# Patient Record
Sex: Female | Born: 2000 | Race: White | Hispanic: No | Marital: Single | Smoking: Never smoker
Health system: Southern US, Community
[De-identification: ages and names within clinical notes are randomized; demographics above are authoritative.]

## PROBLEM LIST (undated history)

## (undated) VITALS — BP 145/82 | HR 98 | Temp 97.7°F | Resp 16 | Ht 67.0 in | Wt 349.2 lb

## (undated) DIAGNOSIS — J189 Pneumonia, unspecified organism: Secondary | ICD-10-CM

## (undated) DIAGNOSIS — E669 Obesity, unspecified: Secondary | ICD-10-CM

## (undated) DIAGNOSIS — F419 Anxiety disorder, unspecified: Secondary | ICD-10-CM

## (undated) HISTORY — DX: Obesity, unspecified: E66.9

## (undated) HISTORY — DX: Pneumonia, unspecified organism: J18.9

---
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
## 2022-01-04 ENCOUNTER — Other Ambulatory Visit (HOSPITAL_COMMUNITY): Payer: Self-pay

## 2022-01-16 ENCOUNTER — Ambulatory Visit (INDEPENDENT_AMBULATORY_CARE_PROVIDER_SITE_OTHER): Payer: 59 | Admitting: Nurse Practitioner

## 2022-01-16 ENCOUNTER — Encounter: Payer: Self-pay | Admitting: Nurse Practitioner

## 2022-01-16 VITALS — BP 126/70 | HR 93 | Temp 97.3°F | Ht 66.93 in | Wt 348.7 lb

## 2022-01-16 DIAGNOSIS — E559 Vitamin D deficiency, unspecified: Secondary | ICD-10-CM

## 2022-01-16 DIAGNOSIS — K760 Fatty (change of) liver, not elsewhere classified: Secondary | ICD-10-CM

## 2022-01-16 DIAGNOSIS — R5383 Other fatigue: Secondary | ICD-10-CM

## 2022-01-16 DIAGNOSIS — R112 Nausea with vomiting, unspecified: Secondary | ICD-10-CM | POA: Diagnosis not present

## 2022-01-16 DIAGNOSIS — E8881 Metabolic syndrome: Secondary | ICD-10-CM | POA: Diagnosis not present

## 2022-01-16 DIAGNOSIS — E119 Type 2 diabetes mellitus without complications: Secondary | ICD-10-CM

## 2022-01-16 DIAGNOSIS — K580 Irritable bowel syndrome with diarrhea: Secondary | ICD-10-CM | POA: Diagnosis not present

## 2022-01-16 DIAGNOSIS — E782 Mixed hyperlipidemia: Secondary | ICD-10-CM

## 2022-01-16 DIAGNOSIS — R635 Abnormal weight gain: Secondary | ICD-10-CM

## 2022-01-16 DIAGNOSIS — E88819 Insulin resistance, unspecified: Secondary | ICD-10-CM

## 2022-01-16 DIAGNOSIS — F411 Generalized anxiety disorder: Secondary | ICD-10-CM

## 2022-01-16 NOTE — Progress Notes (Signed)
Established patient visit   Patient: Kayla Kelly   DOB: May 29, 2001   21 y.o. Female  MRN: [id number] Visit Date: 01/16/2022   Chief Complaint  Patient presents with   Follow-up   Subjective    HPI  Follow up -continues to have nausea, vomiting, and diarrhea.  -stopped metformin and added ozempic to help with blood sugars. This has not helped much to improve her GI symptoms. Still waking up in the middle of the night to vomit.  -states that taking dicyclomine does help some. She states that this does worsen her anxiety. Continues to spend a great deal of each day in the bathroom.    Medications: Outpatient Medications Prior to Visit  Medication Sig   blood glucose meter kit and supplies KIT Use to test blood sugar daily   buPROPion (WELLBUTRIN XL) 150 MG 24 hr tablet Take 1 - 2 tablets by mouth daily. **PLEASE CONTACT OUR OFFICE TO SCHEDULE A FOLLOW UP FOR MED REFILLS**   dicyclomine (BENTYL) 20 MG tablet Take 1 tablet (20 mg total) by mouth 3 (three) times daily as needed for spasms.   etodolac (LODINE) 400 MG tablet Take 1 tablet (400 mg total) by mouth 2 (two) times daily.   FLUoxetine (PROZAC) 20 MG capsule Take 3 capsules (60 mg total) by mouth daily.   fluticasone (FLONASE) 50 MCG/ACT nasal spray Place 1-2 sprays into both nostrils daily.   glucose blood test strip Use to test blood sugar daily   Lancets (FREESTYLE) lancets Use to test blood sugar daily   Semaglutide,0.25 or 0.5MG/DOS, (OZEMPIC, 0.25 OR 0.5 MG/DOSE,) 2 MG/3ML SOPN Inject 0.25 mg into the skin once a week.   No facility-administered medications prior to visit.    Review of Systems  Last CBC Lab Results  Component Value Date   WBC 9.5 01/16/2022   HGB 14.2 01/16/2022   HCT 40.6 01/16/2022   MCV 83 01/16/2022   MCH 28.9 01/16/2022   RDW 13.3 01/16/2022   PLT 291 59/74/1638   Last metabolic panel Lab Results  Component Value Date   GLUCOSE 98 01/16/2022   NA 137 01/16/2022   K 4.1 01/16/2022    CL 99 01/16/2022   CO2 22 01/16/2022   BUN 13 01/16/2022   CREATININE 0.68 01/16/2022   EGFR 129 01/16/2022   CALCIUM 9.4 01/16/2022   PROT 7.2 01/16/2022   ALBUMIN 4.5 01/16/2022   LABGLOB 2.7 01/16/2022   AGRATIO 1.7 01/16/2022   BILITOT 0.5 01/16/2022   ALKPHOS 75 01/16/2022   AST 20 01/16/2022   ALT 25 01/16/2022   ANIONGAP 11 07/07/2019   Last lipids Lab Results  Component Value Date   CHOL 141 01/16/2022   HDL 31 (L) 01/16/2022   LDLCALC 65 01/16/2022   TRIG 281 (H) 01/16/2022   CHOLHDL 4.5 (H) 01/16/2022   Last hemoglobin A1c Lab Results  Component Value Date   HGBA1C 5.9 (H) 01/16/2022   Last thyroid functions Lab Results  Component Value Date   TSH 3.080 01/16/2022   T4TOTAL 7.5 07/12/2019   Last vitamin D Lab Results  Component Value Date   VD25OH 18.2 (L) 01/16/2022        Objective     Today's Vitals   01/16/22 1047 01/16/22 1118  BP: (!) 142/76 126/70  Pulse: 93   Temp: (!) 97.3 F (36.3 C)   SpO2: 100%   Weight: (!) 348 lb 11.2 oz (158.2 kg)   Height: 5' 6.93" (1.7 m)    Body  mass index is 54.73 kg/m.   BP Readings from Last 3 Encounters:  01/16/22 126/70  12/18/21 (!) 146/74  03/12/21 (!) 167/94    Wt Readings from Last 3 Encounters:  01/16/22 (!) 348 lb 11.2 oz (158.2 kg) (>99 %, Z= 2.92)*  12/18/21 (!) 362 lb 1.9 oz (164.3 kg) (>99 %, Z= 2.95)*  01/01/21 (!) 309 lb (140.2 kg) (>99 %, Z= 2.74)*   * Growth percentiles are based on CDC (Girls, 2-20 Years) data.    Physical Exam Vitals and nursing note reviewed.  Constitutional:      Appearance: Normal appearance. She is well-developed. She is obese.  HENT:     Head: Normocephalic and atraumatic.     Nose: Nose normal.     Mouth/Throat:     Mouth: Mucous membranes are moist.     Pharynx: Oropharynx is clear.  Eyes:     Extraocular Movements: Extraocular movements intact.     Conjunctiva/sclera: Conjunctivae normal.     Pupils: Pupils are equal, round, and reactive to  light.  Cardiovascular:     Rate and Rhythm: Normal rate and regular rhythm.     Pulses: Normal pulses.     Heart sounds: Normal heart sounds.  Pulmonary:     Effort: Pulmonary effort is normal.     Breath sounds: Normal breath sounds.  Abdominal:     General: Bowel sounds are normal. There is no distension.     Palpations: Abdomen is soft. There is no mass.     Tenderness: There is abdominal tenderness. There is right CVA tenderness. There is no left CVA tenderness, guarding or rebound.     Hernia: No hernia is present.  Musculoskeletal:        General: Normal range of motion.     Cervical back: Normal range of motion and neck supple.  Lymphadenopathy:     Cervical: No cervical adenopathy.  Skin:    General: Skin is warm and dry.     Capillary Refill: Capillary refill takes less than 2 seconds.  Neurological:     General: No focal deficit present.     Mental Status: She is alert and oriented to person, place, and time.  Psychiatric:        Mood and Affect: Mood normal.        Behavior: Behavior normal.        Thought Content: Thought content normal.        Judgment: Judgment normal.       Assessment & Plan    1. Irritable bowel syndrome with diarrhea Check labs including CMP, amylase, and lipase.  Refer to GI for further evaluation. - Amylase; Future - Lipase; Future - Ambulatory referral to Gastroenterology - Lipase - Amylase - Comprehensive metabolic panel  2. Nausea and vomiting, unspecified vomiting type Unclear etiology.  Refer to GI for further evaluation. - Ambulatory referral to Gastroenterology  3. Hepatic steatosis Check amylase and lipase - Amylase; Future - Lipase; Future - Lipase - Amylase  4. Weight gain, abnormal Check TSH, free T4, CMP, and CBC for further evaluation. - T4, free - TSH - Comprehensive metabolic panel - CBC with Differential/Platelet  5. Mixed hyperlipidemia Check fasting lipid panel and treat hyperlipidemia as  indicated. - Lipid panel; Future - Comprehensive metabolic panel; Future - Comprehensive metabolic panel - Lipid panel - Lipid panel  6. Other fatigue Check CBC and TSH and free T4. - TSH + free T4; Future - CBC; Future - CBC - TSH +  free T4  7. Vitamin D deficiency Check vitamin D and treat deficiency as indicated. - VITAMIN D 25 Hydroxy (Vit-D Deficiency, Fractures); Future - VITAMIN D 25 Hydroxy (Vit-D Deficiency, Fractures)  8. Type 2 diabetes mellitus without complication, without long-term current use of insulin (HCC) Check hemoglobin A1c and urine microalbumin.  Increase Ozempic to 0.5 mg weekly. - Hemoglobin A1c; Future - Comprehensive metabolic panel; Future - CBC; Future - CBC - Comprehensive metabolic panel - Hemoglobin A1c - Microalbumin / creatinine urine ratio - T4, free - TSH - Comprehensive metabolic panel - CBC with Differential/Platelet  9. GAD (generalized anxiety disorder) Continue fluoxetine as previously prescribed.   Problem List Items Addressed This Visit       Digestive   Hepatic steatosis   Relevant Orders   Amylase (Completed)   Lipase (Completed)   Irritable bowel syndrome with diarrhea - Primary   Relevant Orders   Amylase (Completed)   Lipase (Completed)   Ambulatory referral to Gastroenterology   Nausea and vomiting   Relevant Orders   Ambulatory referral to Gastroenterology     Endocrine   Insulin resistance   Relevant Orders   Hemoglobin A1c (Completed)   Type 2 diabetes mellitus without complication, without long-term current use of insulin (HCC)   Relevant Orders   Hemoglobin A1c (Completed)   Comprehensive metabolic panel (Completed)   CBC (Completed)     Other   GAD (generalized anxiety disorder)   Vitamin D deficiency   Relevant Orders   VITAMIN D 25 Hydroxy (Vit-D Deficiency, Fractures) (Completed)   Mixed hyperlipidemia   Relevant Orders   Lipid panel (Completed)   Comprehensive metabolic panel (Completed)    Weight gain, abnormal   Other fatigue   Relevant Orders   TSH + free T4 (Completed)   CBC (Completed)     Return in about 6 weeks (around 02/27/2022) for mood, med refills.         Ronnell Freshwater, NP  [facility] at [facility] [phone] (phone) [phone] (fax)  [place]

## 2022-01-17 LAB — COMPREHENSIVE METABOLIC PANEL
ALT: 25 IU/L (ref 0–32)
AST: 20 IU/L (ref 0–40)
Albumin/Globulin Ratio: 1.7 (ref 1.2–2.2)
Albumin: 4.5 g/dL (ref 3.9–5.0)
Alkaline Phosphatase: 75 IU/L (ref 42–106)
BUN/Creatinine Ratio: 19 (ref 9–23)
BUN: 13 mg/dL (ref 6–20)
Bilirubin Total: 0.5 mg/dL (ref 0.0–1.2)
CO2: 22 mmol/L (ref 20–29)
Calcium: 9.4 mg/dL (ref 8.7–10.2)
Chloride: 99 mmol/L (ref 96–106)
Creatinine, Ser: 0.68 mg/dL (ref 0.57–1.00)
Globulin, Total: 2.7 g/dL (ref 1.5–4.5)
Glucose: 98 mg/dL (ref 70–99)
Potassium: 4.1 mmol/L (ref 3.5–5.2)
Sodium: 137 mmol/L (ref 134–144)
Total Protein: 7.2 g/dL (ref 6.0–8.5)
eGFR: 129 mL/min/{1.73_m2} (ref >59)

## 2022-01-17 LAB — CBC
Hematocrit: 40.6 % (ref 34.0–46.6)
Hemoglobin: 14.2 g/dL (ref 11.1–15.9)
MCH: 28.9 pg (ref 26.6–33.0)
MCHC: 35 g/dL (ref 31.5–35.7)
MCV: 83 fL (ref 79–97)
Platelets: 291 10*3/uL (ref 150–450)
RBC: 4.92 x10E6/uL (ref 3.77–5.28)
RDW: 13.3 % (ref 11.7–15.4)
WBC: 9.5 10*3/uL (ref 3.4–10.8)

## 2022-01-17 LAB — TSH+FREE T4
Free T4: 1.24 ng/dL (ref 0.93–1.60)
TSH: 3.08 u[IU]/mL (ref 0.450–4.500)

## 2022-01-17 LAB — LIPID PANEL
Chol/HDL Ratio: 4.5 ratio — ABNORMAL HIGH (ref 0.0–4.4)
Cholesterol, Total: 141 mg/dL (ref 100–169)
HDL: 31 mg/dL — ABNORMAL LOW (ref >39)
LDL Chol Calc (NIH): 65 mg/dL (ref 0–109)
Triglycerides: 281 mg/dL — ABNORMAL HIGH (ref 0–89)
VLDL Cholesterol Cal: 45 mg/dL — ABNORMAL HIGH (ref 5–40)

## 2022-01-17 LAB — HEMOGLOBIN A1C
Est. average glucose Bld gHb Est-mCnc: 123 mg/dL
Hgb A1c MFr Bld: 5.9 % — ABNORMAL HIGH (ref 4.8–5.6)

## 2022-01-17 LAB — VITAMIN D 25 HYDROXY (VIT D DEFICIENCY, FRACTURES): Vit D, 25-Hydroxy: 18.2 ng/mL — ABNORMAL LOW (ref 30.0–100.0)

## 2022-01-17 LAB — LIPASE: Lipase: 36 U/L (ref 14–72)

## 2022-01-17 LAB — AMYLASE: Amylase: 26 U/L — ABNORMAL LOW (ref 31–110)

## 2022-01-17 NOTE — Progress Notes (Signed)
Lipids mildly elevated. Patient has vitamin d deficiency. Treat with drisdol

## 2022-01-18 ENCOUNTER — Telehealth: Payer: Self-pay | Admitting: Nurse Practitioner

## 2022-01-18 NOTE — Telephone Encounter (Signed)
Patient left vm stating she had a couple of questions regarding her medication and would like for you to call her at [phone]

## 2022-01-18 NOTE — Telephone Encounter (Signed)
Pt called wanting to know should her Ozempic be upgraded

## 2022-01-20 DIAGNOSIS — R110 Nausea: Secondary | ICD-10-CM | POA: Insufficient documentation

## 2022-01-20 DIAGNOSIS — R112 Nausea with vomiting, unspecified: Secondary | ICD-10-CM | POA: Insufficient documentation

## 2022-01-20 DIAGNOSIS — R635 Abnormal weight gain: Secondary | ICD-10-CM | POA: Insufficient documentation

## 2022-01-20 DIAGNOSIS — R5383 Other fatigue: Secondary | ICD-10-CM | POA: Insufficient documentation

## 2022-01-20 NOTE — Telephone Encounter (Signed)
Does she mean the dose increased?

## 2022-01-21 NOTE — Telephone Encounter (Signed)
Called pt LVM to contact the office °

## 2022-01-21 NOTE — Telephone Encounter (Signed)
Please let her know that she can start increasing this to 0.5 mg weekly with he next dose. The higher dose is in the same pen. Thanks  -HB

## 2022-01-23 NOTE — Telephone Encounter (Signed)
Called pt LVM to contact the office °

## 2022-01-24 NOTE — Telephone Encounter (Signed)
Called pt she is advised about her Rx of her new dose

## 2022-02-05 ENCOUNTER — Other Ambulatory Visit: Payer: Self-pay | Admitting: Nurse Practitioner

## 2022-02-05 ENCOUNTER — Other Ambulatory Visit (HOSPITAL_COMMUNITY): Payer: Self-pay

## 2022-02-05 DIAGNOSIS — E119 Type 2 diabetes mellitus without complications: Secondary | ICD-10-CM

## 2022-02-05 MED ORDER — OZEMPIC (0.25 OR 0.5 MG/DOSE) 2 MG/3ML ~~LOC~~ SOPN
0.2500 mg | PEN_INJECTOR | SUBCUTANEOUS | 2 refills | Status: DC
  Filled 2022-02-05: qty 3, 56d supply, fill #0
  Filled 2022-04-19: qty 3, 56d supply, fill #1
  Filled 2022-05-31: qty 3, 56d supply, fill #2

## 2022-02-05 NOTE — Progress Notes (Signed)
Discuss with patient at visit 02/25/2022.

## 2022-02-09 ENCOUNTER — Other Ambulatory Visit (HOSPITAL_COMMUNITY): Payer: Self-pay

## 2022-02-20 ENCOUNTER — Telehealth: Payer: Self-pay | Admitting: Nurse Practitioner

## 2022-02-20 NOTE — Telephone Encounter (Signed)
Patient needs immunization forms printed for college. If you can do this and I can call her when ready.

## 2022-02-20 NOTE — Telephone Encounter (Signed)
Done. Placed in the accordion file. AS, CMA

## 2022-02-24 NOTE — Progress Notes (Deleted)
Established patient visit   Patient: Kayla Kelly   DOB: 2001-05-13   21 y.o. Female  MRN: [id number] Visit Date: 02/25/2022   No chief complaint on file.  Subjective    HPI  Follow up  -increased ozempic to 0.5 mg weekly  -now taking bupropion XL 150 mg - increased dose to 1 to 2 tablets daily. Taking prozac 60 mg daily.  -referred to GI due to moderate IBS with diarrhea.    Medications: Outpatient Medications Prior to Visit  Medication Sig   blood glucose meter kit and supplies KIT Use to test blood sugar daily   buPROPion (WELLBUTRIN XL) 150 MG 24 hr tablet Take 1 - 2 tablets by mouth daily. **PLEASE CONTACT OUR OFFICE TO SCHEDULE A FOLLOW UP FOR MED REFILLS**   dicyclomine (BENTYL) 20 MG tablet Take 1 tablet (20 mg total) by mouth 3 (three) times daily as needed for spasms.   etodolac (LODINE) 400 MG tablet Take 1 tablet (400 mg total) by mouth 2 (two) times daily.   FLUoxetine (PROZAC) 20 MG capsule Take 3 capsules (60 mg total) by mouth daily.   fluticasone (FLONASE) 50 MCG/ACT nasal spray Place 1-2 sprays into both nostrils daily.   glucose blood test strip Use to test blood sugar daily   Lancets (FREESTYLE) lancets Use to test blood sugar daily   Semaglutide,0.25 or 0.5MG/DOS, (OZEMPIC, 0.25 OR 0.5 MG/DOSE,) 2 MG/3ML SOPN Inject 0.25 mg into the skin once a week.   No facility-administered medications prior to visit.    Review of Systems  {Labs (Optional):23779}   Objective    There were no vitals taken for this visit. BP Readings from Last 3 Encounters:  01/16/22 126/70  12/18/21 (!) 146/74  03/12/21 (!) 167/94    Wt Readings from Last 3 Encounters:  01/16/22 (!) 348 lb 11.2 oz (158.2 kg) (>99 %, Z= 2.92)*  12/18/21 (!) 362 lb 1.9 oz (164.3 kg) (>99 %, Z= 2.95)*  01/01/21 (!) 309 lb (140.2 kg) (>99 %, Z= 2.74)*   * Growth percentiles are based on CDC (Girls, 2-20 Years) data.    Physical Exam  ***  No results found for any visits on 02/25/22.   Assessment & Plan     Problem List Items Addressed This Visit   None    No follow-ups on file.         Ronnell Freshwater, NP  [facility] at [facility] [phone] (phone) [phone] (fax)  [place]

## 2022-02-25 ENCOUNTER — Ambulatory Visit: Payer: 59 | Admitting: Nurse Practitioner

## 2022-03-04 DIAGNOSIS — R110 Nausea: Secondary | ICD-10-CM | POA: Diagnosis not present

## 2022-03-04 DIAGNOSIS — Z23 Encounter for immunization: Secondary | ICD-10-CM | POA: Diagnosis not present

## 2022-03-05 ENCOUNTER — Other Ambulatory Visit (HOSPITAL_COMMUNITY): Payer: Self-pay

## 2022-03-27 ENCOUNTER — Other Ambulatory Visit (HOSPITAL_COMMUNITY): Payer: Self-pay

## 2022-03-27 ENCOUNTER — Encounter: Payer: Self-pay | Admitting: Gastroenterology

## 2022-03-27 ENCOUNTER — Other Ambulatory Visit (INDEPENDENT_AMBULATORY_CARE_PROVIDER_SITE_OTHER): Payer: 59

## 2022-03-27 ENCOUNTER — Ambulatory Visit (INDEPENDENT_AMBULATORY_CARE_PROVIDER_SITE_OTHER): Payer: 59 | Admitting: Gastroenterology

## 2022-03-27 VITALS — BP 130/90 | HR 110 | Ht 67.0 in | Wt 344.4 lb

## 2022-03-27 DIAGNOSIS — R112 Nausea with vomiting, unspecified: Secondary | ICD-10-CM

## 2022-03-27 DIAGNOSIS — R197 Diarrhea, unspecified: Secondary | ICD-10-CM | POA: Diagnosis not present

## 2022-03-27 LAB — COMPREHENSIVE METABOLIC PANEL
ALT: 35 U/L (ref 0–35)
AST: 31 U/L (ref 0–37)
Albumin: 4.2 g/dL (ref 3.5–5.2)
Alkaline Phosphatase: 61 U/L (ref 47–119)
BUN: 13 mg/dL (ref 6–23)
CO2: 27 mEq/L (ref 19–32)
Calcium: 9.4 mg/dL (ref 8.4–10.5)
Chloride: 102 mEq/L (ref 96–112)
Creatinine, Ser: 0.61 mg/dL (ref 0.40–1.20)
GFR: 130.16 mL/min (ref >60.00)
Glucose, Bld: 80 mg/dL (ref 70–99)
Potassium: 3.9 mEq/L (ref 3.5–5.1)
Sodium: 138 mEq/L (ref 135–145)
Total Bilirubin: 0.6 mg/dL (ref 0.3–1.2)
Total Protein: 7.5 g/dL (ref 6.0–8.3)

## 2022-03-27 LAB — C-REACTIVE PROTEIN: CRP: <1 mg/dL (ref 0.5–20.0)

## 2022-03-27 MED ORDER — PANTOPRAZOLE SODIUM 40 MG PO TBEC
40.0000 mg | DELAYED_RELEASE_TABLET | Freq: Two times a day (BID) | ORAL | 5 refills | Status: DC
Start: 2022-03-27 — End: 2023-03-07
  Filled 2022-03-27: qty 60, 30d supply, fill #0

## 2022-03-27 NOTE — Patient Instructions (Signed)
It was my pleasure to provide care to you today. Based on our discussion, I am providing you with my recommendations below:  RECOMMENDATION(S):   Consider trial of peppermint (Roena Sassaman's Tummy Tamers).  Consider alternative treatments to Ozempic.  PRESCRIPTION MEDICATION(S):   We have sent the following medication(s) to your pharmacy:  Pantoprazole 40 mg twice daily 30-60 minutes before breakfast and dinner.   LABS:   Please proceed to the basement level for lab work before leaving today. Press "B" on the elevator. The lab is located at the first door on the left as you exit the elevator.  ABDOMINAL ULTRASOUND:  You have been scheduled for an abdominal ultrasound at [facility] (1st floor of the hospital) on Thursday 03/28/22 at 10 am. Please arrive @ 9:30 am for registration.   PREP:   Please DO NOT eat or drink anything 6 hours prior to your test.  NEED TO RESCHEDULE?   Please call radiology at [phone].  FOLLOW UP:  I would like for you to follow up with me in 4 weeks. Please call the office at [phone] to schedule your appointment.  BMI:  If you are age 19 or older, your body mass index should be between 23-30. Your Body mass index is 53.94 kg/m. If this is out of the aforementioned range listed, please consider follow up with your Primary Care Provider.  If you are age 39 or younger, your body mass index should be between 19-25. Your Body mass index is 53.94 kg/m. If this is out of the aformentioned range listed, please consider follow up with your Primary Care Provider.   MY CHART:  The [place] [facility] providers would like to encourage you to use [facility] to communicate with providers for non-urgent requests or questions.  Due to long hold times on the telephone, sending your provider a message by [facility] may be a faster and more efficient way to get a response.  Please allow 48 business hours for a response.  Please remember that this is for non-urgent  requests.   Thank you for trusting me with your gastrointestinal care!    Tressia Danas, MD, MPH

## 2022-03-27 NOTE — Progress Notes (Signed)
Referring Provider: Ronnell Freshwater, NP Primary Care Physician:  Ronnell Freshwater, NP   Reason for Consultation:  Diarrhea, nausea, and vomiting   IMPRESSION:  Diarrhea, nausea, and vomiting BMI 53.94 Ongoing treatment with Ozempic Echogenic liver on ultrasound a [facility] 2020  GI symptoms may be related to Ozempic. However, she is concerned that symptoms predate starting Ozempic. Differential is broad.   PLAN: - Start pantoprazole 40 mg BID - Consider trial of peppermint (Heather's Tummy Tamers) if not responding to pantoprazole - Consider alternative treatments to Ozempic - Stool for fecal calprotectin, pancreatic elastase, H pylori stool antigen and Giardia - TTGA and IgA to screen for celiac disease - Alpha Gal panel - CMP, CRP - Abdominal ultrasound - Gastric emptying scan to be considered although not likely to be helpful in the setting of Ozempic  - Office follow-up in 2-4 weeks, earlier if needed   HPI: Kayla Kelly is a 21 y.o. female referred by NP [facility] for further evaluation of IBS with diarrhea, nausea, and vomiting.  The history is obtained through the patient and review of her electronic health record.  She has anxiety, depression, type 2 diabetes, vitamin D deficiency, hyperlipidemia, elevated liver enzymes and anxiety. She started at [employer] two weeks ago. She is looking at fashion merchandising.   Lifetime history of GI symptoms.   Currently has watery diarrhea 5 times daily, nausea, and vomiting particularly on an empty stomach and when she eats. Symptoms first started in 8th grade but it has been significantly worse over the last year. She feels like she is in the bathroom all day even though she has a sense of complete evacuation. Associated diaphoresis. Frequently has vomiting when she first wakes up. Symptoms are then improved.  No blood or mucous in the stool.  She has diabetes.  This was previously managed with metformin but she is currently  using Ozempic. She feels like the symptoms predate starting Ozempic.  She continues to have nocturnal awakening with the need to vomit.  She will use protein shakes even up to every meal because she can't tolerate other symptoms. She does not find the protein shakes worsen her symptoms. No specific foods that trigger her symptoms, although she is unable to eat red meat when she is not feeling well.  She has been using dicyclomine with some improvement in her symptoms. Ginger tea provides minimal relief. She has tried reducing sweats and eating cleaner but her symptoms don't change. Symptoms present even while on Atkins last year. Rare use of TUMS or acid reflux. No other treatment trials.   Labs 01/16/2022 including normal CBC and CMP, hemoglobin A1c 5.9, TSH 3.080, normal lipase and a low amylase.  ALT 25.  Abdominal ultrasound at [facility] in 2020 to evaluate elevated liver enzymes showed an echogenic liver  No prior endoscopic evaluation  Mom and Dad have had gastric bypass. They both have chronic GI symptoms.  There is no known family history of colon cancer or polyps. No family history of stomach cancer or other GI malignancy. No family history of inflammatory bowel disease or celiac.  There is a family history of factor V Leiden.  She has some questions about Crohn's after her online searching.    Past Medical History:  Diagnosis Date   Anxiety     No past surgical history on file.    Current Outpatient Medications  Medication Sig Dispense Refill   blood glucose meter kit and supplies KIT Use to test blood  sugar daily 1 each 0   buPROPion (WELLBUTRIN XL) 150 MG 24 hr tablet Take 1 - 2 tablets by mouth daily. **PLEASE CONTACT OUR OFFICE TO SCHEDULE A FOLLOW UP FOR MED REFILLS** 60 tablet 1   dicyclomine (BENTYL) 20 MG tablet Take 1 tablet (20 mg total) by mouth 3 (three) times daily as needed for spasms. 45 tablet 1   etodolac (LODINE) 400 MG tablet Take 1 tablet (400 mg total)  by mouth 2 (two) times daily. 60 tablet 1   FLUoxetine (PROZAC) 20 MG capsule Take 3 capsules (60 mg total) by mouth daily. 270 capsule 1   fluticasone (FLONASE) 50 MCG/ACT nasal spray Place 1-2 sprays into both nostrils daily. 16 g 0   glucose blood test strip Use to test blood sugar daily 100 each 0   Lancets (FREESTYLE) lancets Use to test blood sugar daily 100 each 0   Semaglutide,0.25 or 0.5MG/DOS, (OZEMPIC, 0.25 OR 0.5 MG/DOSE,) 2 MG/3ML SOPN Inject 0.25 mg into the skin once a week. 3 mL 2   No current facility-administered medications for this visit.    Allergies as of 03/27/2022 - Review Complete 01/16/2022  Allergen Reaction Noted   Benzalkonium chloride Anaphylaxis 12/16/2018   Neomycin-bacitracin zn-polymyx Anaphylaxis 12/12/2020   Neosporin [bacitracin-polymyxin b]  07/06/2019    Family History  Problem Relation Age of Onset   Heart attack Mother    High Cholesterol Mother    High blood pressure Mother    High Cholesterol Father    High blood pressure Father    Cancer - Cervical Maternal Grandmother    Cancer - Colon Maternal Grandmother     Social History   Socioeconomic History   Marital status: Single    Spouse name: Not on file   Number of children: Not on file   Years of education: Not on file   Highest education level: Not on file  Occupational History   Not on file  Tobacco Use   Smoking status: Never   Smokeless tobacco: Never  Vaping Use   Vaping Use: Never used  Substance and Sexual Activity   Alcohol use: Never   Drug use: Never   Sexual activity: Never  Other Topics Concern   Not on file  Social History Narrative   Not on file   Social Determinants of Health   Financial Resource Strain: Not on file  Food Insecurity: Not on file  Transportation Needs: Not on file  Physical Activity: Not on file  Stress: Not on file  Social Connections: Not on file  Intimate Partner Violence: Not on file    Review of Systems: 12 system ROS is  negative except as noted above with the addition of anxiety, back pain, depression, fatigue, headaches, night sweats.   Physical Exam: General:   Alert,  well-nourished, pleasant and cooperative in NAD Head:  Normocephalic and atraumatic. Eyes:  Sclera clear, no icterus.   Conjunctiva pink. Ears:  Normal auditory acuity. Nose:  No deformity, discharge,  or lesions. Mouth:  No deformity or lesions.   Neck:  Supple; no masses or thyromegaly. Lungs:  Clear throughout to auscultation.   No wheezes. Heart:  Regular rate and rhythm; no murmurs. Abdomen:  Soft, central obesity, nontender, nondistended, normal bowel sounds, no rebound or guarding. No hepatosplenomegaly.   Rectal:  Deferred  Msk:  Symmetrical. No boney deformities LAD: No inguinal or umbilical LAD Extremities:  No clubbing or edema. Neurologic:  Alert and  oriented x4;  grossly nonfocal Skin:  Intact without significant lesions or rashes. Psych:  Alert and cooperative. Normal mood and affect.     Ardyth Kelso L. Tarri Glenn, MD, MPH 03/27/2022, 1:15 PM

## 2022-03-28 ENCOUNTER — Ambulatory Visit (HOSPITAL_COMMUNITY): Payer: 59

## 2022-04-01 LAB — TISSUE TRANSGLUTAMINASE ABS,IGG,IGA
(tTG) Ab, IgA: <1 U/mL
(tTG) Ab, IgG: <1 U/mL

## 2022-04-01 LAB — ALPHA-GAL PANEL
Allergen, Mutton, f88: <0.1 kU/L
Allergen, Pork, f26: <0.1 kU/L
Beef: <0.1 kU/L
CLASS: 0
CLASS: 0
Class: 0
GALACTOSE-ALPHA-1,3-GALACTOSE IGE*: <0.1 kU/L (ref <0.10)

## 2022-04-01 LAB — IGA: Immunoglobulin A: 252 mg/dL (ref 47–310)

## 2022-04-01 LAB — INTERPRETATION:

## 2022-04-05 ENCOUNTER — Other Ambulatory Visit (HOSPITAL_COMMUNITY): Payer: Self-pay

## 2022-04-19 ENCOUNTER — Other Ambulatory Visit (HOSPITAL_COMMUNITY): Payer: Self-pay

## 2022-04-24 ENCOUNTER — Ambulatory Visit: Payer: 59 | Admitting: Gastroenterology

## 2022-05-10 ENCOUNTER — Ambulatory Visit: Payer: 59 | Admitting: Radiology

## 2022-05-10 ENCOUNTER — Encounter: Payer: Self-pay | Admitting: Radiology

## 2022-05-10 VITALS — BP 158/90 | Ht 67.0 in | Wt 349.0 lb

## 2022-05-10 DIAGNOSIS — D6851 Activated protein C resistance: Secondary | ICD-10-CM | POA: Diagnosis not present

## 2022-05-10 DIAGNOSIS — R030 Elevated blood-pressure reading, without diagnosis of hypertension: Secondary | ICD-10-CM

## 2022-05-10 DIAGNOSIS — Z3009 Encounter for other general counseling and advice on contraception: Secondary | ICD-10-CM

## 2022-05-10 DIAGNOSIS — Z01419 Encounter for gynecological examination (general) (routine) without abnormal findings: Secondary | ICD-10-CM

## 2022-05-10 MED ORDER — SLYND 4 MG PO TABS: 1.0000 | ORAL_TABLET | Freq: Every day | ORAL | 4 refills | Status: DC

## 2022-05-10 NOTE — Progress Notes (Signed)
   Shataria Crist 07-04-2001 [id number]   History:  21 y.o. G0 presents for annual exam as a new patient. Hx of Factor V Leiden. Wants to discuss [facility] options, yet to have sexual debut. Bisexual. In school at [employer].  Gynecologic History Patient's last menstrual period was 05/03/2022 (approximate). Period Pattern: (!) Irregular Menstrual Control: Maxi pad, Panty liner Dysmenorrhea: (!) Mild Dysmenorrhea Symptoms: Cramping Contraception/Family planning: abstinence Sexually active: no   Obstetric History OB History  Gravida Para Term Preterm AB Living  0 0 0 0 0 0  SAB IAB Ectopic Multiple Live Births  0 0 0 0 0     The following portions of the patient's history were reviewed and updated as appropriate: allergies, current medications, past family history, past medical history, past social history, past surgical history, and problem list.  Review of Systems Pertinent items noted in HPI and remainder of comprehensive ROS otherwise negative.   Past medical history, past surgical history, family history and social history were all reviewed and documented in the EPIC chart.   Exam:  Vitals:   05/10/22 1543  BP: (!) 162/92  Weight: (!) 349 lb (158.3 kg)  Height: 5\' 7"  (1.702 m)  Repeat BP 158/90 Body mass index is 54.66 kg/m.  General appearance:  Normal, obese Respiratory  Auscultation:  Clear without wheezing or rhonchi Cardiovascular  Auscultation:  Regular rate, without rubs, murmurs or gallops  Edema/varicosities:  Not grossly evident Abdominal  Soft,nontender, without masses, guarding or rebound.  Liver/spleen:  No organomegaly noted  Hernia:  None appreciated  Skin  Inspection:  Grossly normal Breasts: Examined lying and sitting.   Right: Without masses, retractions, nipple discharge or axillary adenopathy.   Left: Without masses, retractions, nipple discharge or axillary adenopathy. Genitourinary   deferred   Patient informed chaperone available to be present  for breast and pelvic exam. Patient has requested no chaperone to be present. Patient has been advised what will be completed during breast and pelvic exam.   Assessment/Plan:   1. Well woman exam with routine gynecological exam Pap at 23 STI screen once sexually active  2. Counseling for birth control, oral contraceptives - Drospirenone (SLYND) 4 MG TABS; Take 1 tablet by mouth daily.  Dispense: 84 tablet; Refill: 4  3. Factor V Leiden mutation [facility]) Not an estrogen candidate Discussed all progesterone only options including POPs, Nexplanon, IUD and depo  4. Elevated blood pressure reading Continue to monitor F/u with PCP   Discussed SBE,pap and STI screening as directed/appropriate. Recommend 14mins of exercise weekly, including weight bearing exercise. Encouraged the use of seatbelts and sunscreen. Return in 1 year for annual or as needed.   Rubbie Battiest B WHNP-BC 4:09 PM 05/10/2022

## 2022-05-16 ENCOUNTER — Telehealth: Payer: Self-pay

## 2022-05-16 NOTE — Telephone Encounter (Signed)
Pt is calling after being prescribed Drospirenone (SLYND) 4 MG TABS And currently taking Semaglutide,0.25 or 0.5MG /DOS, (OZEMPIC, 0.25 OR 0.5 MG/DOSE,) 2 MG/3ML SOPN.  Pt is concerned that the [place] may cause her Birth control to not be as effective as it should be. Pt is wondering should she be concerned.  Please advise

## 2022-05-21 NOTE — Telephone Encounter (Signed)
Please let her know that I reviewed the interactions. The ozempic does delay the absorption of oral medications as it does delay gastric emptying. With the [facility], she needs to make sure she takes it on routine basis at the same time each day in order to keep the levels steady.

## 2022-05-21 NOTE — Telephone Encounter (Signed)
Called pt LVM to contact the office °

## 2022-05-22 NOTE — Telephone Encounter (Signed)
Called pt she is advised of the interaction of the medication and her recommendation

## 2022-05-31 ENCOUNTER — Other Ambulatory Visit: Payer: Self-pay | Admitting: Nurse Practitioner

## 2022-05-31 ENCOUNTER — Other Ambulatory Visit (HOSPITAL_COMMUNITY): Payer: Self-pay

## 2022-05-31 DIAGNOSIS — F411 Generalized anxiety disorder: Secondary | ICD-10-CM

## 2022-05-31 DIAGNOSIS — E119 Type 2 diabetes mellitus without complications: Secondary | ICD-10-CM

## 2022-06-03 ENCOUNTER — Other Ambulatory Visit (HOSPITAL_COMMUNITY): Payer: Self-pay

## 2022-06-03 MED ORDER — OZEMPIC (0.25 OR 0.5 MG/DOSE) 2 MG/3ML ~~LOC~~ SOPN
0.2500 mg | PEN_INJECTOR | SUBCUTANEOUS | 0 refills | Status: DC
  Filled 2022-06-03 (×2): qty 9, 168d supply, fill #0
  Filled 2022-06-05: qty 3, 56d supply, fill #0

## 2022-06-03 MED ORDER — BUPROPION HCL ER (XL) 150 MG PO TB24
150.0000 mg | ORAL_TABLET | Freq: Every day | ORAL | 0 refills | Status: DC
  Filled 2022-06-03 – 2022-06-12 (×2): qty 180, 90d supply, fill #0

## 2022-06-05 ENCOUNTER — Other Ambulatory Visit (HOSPITAL_COMMUNITY): Payer: Self-pay

## 2022-06-10 ENCOUNTER — Other Ambulatory Visit (HOSPITAL_COMMUNITY): Payer: Self-pay

## 2022-06-10 DIAGNOSIS — N921 Excessive and frequent menstruation with irregular cycle: Secondary | ICD-10-CM

## 2022-06-11 ENCOUNTER — Other Ambulatory Visit (HOSPITAL_COMMUNITY): Payer: Self-pay

## 2022-06-11 NOTE — Telephone Encounter (Signed)
Skip the placebos and Start the new pack early

## 2022-06-12 ENCOUNTER — Other Ambulatory Visit (HOSPITAL_COMMUNITY): Payer: Self-pay

## 2022-06-14 ENCOUNTER — Other Ambulatory Visit (HOSPITAL_COMMUNITY): Payer: Self-pay

## 2022-06-26 NOTE — Telephone Encounter (Signed)
It may take a full pack to resolve the bleeding

## 2022-07-09 ENCOUNTER — Other Ambulatory Visit: Payer: Self-pay | Admitting: Nurse Practitioner

## 2022-07-09 ENCOUNTER — Encounter: Payer: Self-pay | Admitting: Nurse Practitioner

## 2022-07-09 DIAGNOSIS — E119 Type 2 diabetes mellitus without complications: Secondary | ICD-10-CM

## 2022-07-09 NOTE — Telephone Encounter (Signed)
Order placed for ultrasound. Please help with scheduling.

## 2022-07-09 NOTE — Telephone Encounter (Signed)
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 She will likely bleeding more with a POP that is not Slynd but we can try it. Her other option would be an IUD.

## 2022-07-09 NOTE — Telephone Encounter (Signed)
Schedule us

## 2022-07-10 ENCOUNTER — Other Ambulatory Visit: Payer: Self-pay | Admitting: Nurse Practitioner

## 2022-07-10 ENCOUNTER — Other Ambulatory Visit (HOSPITAL_COMMUNITY): Payer: Self-pay

## 2022-07-10 DIAGNOSIS — E119 Type 2 diabetes mellitus without complications: Secondary | ICD-10-CM

## 2022-07-10 MED ORDER — SEMAGLUTIDE (1 MG/DOSE) 4 MG/3ML ~~LOC~~ SOPN
1.0000 mg | PEN_INJECTOR | SUBCUTANEOUS | 3 refills | Status: DC
  Filled 2022-07-10 – 2022-07-25 (×3): qty 3, 28d supply, fill #0
  Filled 2022-08-23: qty 3, 28d supply, fill #1
  Filled 2022-09-14 – 2022-09-16 (×2): qty 3, 28d supply, fill #2

## 2022-07-11 ENCOUNTER — Other Ambulatory Visit (HOSPITAL_COMMUNITY): Payer: Self-pay

## 2022-07-25 ENCOUNTER — Other Ambulatory Visit: Payer: Self-pay

## 2022-07-25 ENCOUNTER — Other Ambulatory Visit: Payer: Self-pay | Admitting: Nurse Practitioner

## 2022-07-25 ENCOUNTER — Other Ambulatory Visit (HOSPITAL_COMMUNITY): Payer: Self-pay

## 2022-07-25 DIAGNOSIS — F332 Major depressive disorder, recurrent severe without psychotic features: Secondary | ICD-10-CM

## 2022-07-25 DIAGNOSIS — F411 Generalized anxiety disorder: Secondary | ICD-10-CM

## 2022-07-25 MED ORDER — FLUOXETINE HCL 20 MG PO CAPS
60.0000 mg | ORAL_CAPSULE | Freq: Every day | ORAL | 1 refills | Status: DC
  Filled 2022-07-25 – 2022-09-14 (×3): qty 270, 90d supply, fill #0

## 2022-07-26 ENCOUNTER — Other Ambulatory Visit (HOSPITAL_COMMUNITY): Payer: Self-pay

## 2022-07-30 NOTE — Telephone Encounter (Signed)
We do not offer the abortion pills to have on hand and we do not offer abortion services in office. She would need to be referred to [facility] should she need those services.  I would recommend paying OOP for her generic pills this month if she can not get in touch with her insurance company. If she has had any unprotected sex in the past 5 days we can send in plan B as well.

## 2022-08-16 ENCOUNTER — Other Ambulatory Visit (HOSPITAL_BASED_OUTPATIENT_CLINIC_OR_DEPARTMENT_OTHER): Payer: Self-pay

## 2022-08-23 ENCOUNTER — Other Ambulatory Visit (HOSPITAL_COMMUNITY): Payer: Self-pay

## 2022-08-26 ENCOUNTER — Other Ambulatory Visit (HOSPITAL_COMMUNITY): Payer: Self-pay

## 2022-09-10 ENCOUNTER — Other Ambulatory Visit (HOSPITAL_COMMUNITY): Payer: Self-pay

## 2022-09-14 ENCOUNTER — Other Ambulatory Visit: Payer: Self-pay | Admitting: Nurse Practitioner

## 2022-09-14 ENCOUNTER — Other Ambulatory Visit (HOSPITAL_COMMUNITY): Payer: Self-pay

## 2022-09-14 DIAGNOSIS — F411 Generalized anxiety disorder: Secondary | ICD-10-CM

## 2022-09-16 ENCOUNTER — Other Ambulatory Visit: Payer: Self-pay

## 2022-09-18 ENCOUNTER — Other Ambulatory Visit (HOSPITAL_COMMUNITY): Payer: Self-pay

## 2022-09-23 ENCOUNTER — Encounter: Payer: Self-pay | Admitting: Nurse Practitioner

## 2022-10-10 ENCOUNTER — Other Ambulatory Visit: Payer: Self-pay | Admitting: Nurse Practitioner

## 2022-10-10 ENCOUNTER — Other Ambulatory Visit: Payer: Self-pay

## 2022-10-10 DIAGNOSIS — K580 Irritable bowel syndrome with diarrhea: Secondary | ICD-10-CM

## 2022-10-12 ENCOUNTER — Other Ambulatory Visit (HOSPITAL_COMMUNITY): Payer: Self-pay

## 2022-10-14 ENCOUNTER — Encounter: Payer: Self-pay | Admitting: Nurse Practitioner

## 2022-10-14 ENCOUNTER — Other Ambulatory Visit (HOSPITAL_COMMUNITY): Payer: Self-pay

## 2022-10-14 ENCOUNTER — Other Ambulatory Visit: Payer: Self-pay | Admitting: Nurse Practitioner

## 2022-10-14 DIAGNOSIS — E119 Type 2 diabetes mellitus without complications: Secondary | ICD-10-CM

## 2022-10-14 DIAGNOSIS — K580 Irritable bowel syndrome with diarrhea: Secondary | ICD-10-CM

## 2022-10-14 MED ORDER — DICYCLOMINE HCL 20 MG PO TABS
20.0000 mg | ORAL_TABLET | Freq: Three times a day (TID) | ORAL | 2 refills | Status: DC | PRN
  Filled 2022-10-14: qty 90, 30d supply, fill #0

## 2022-10-14 MED ORDER — SEMAGLUTIDE (2 MG/DOSE) 8 MG/3ML ~~LOC~~ SOPN
2.0000 mg | PEN_INJECTOR | SUBCUTANEOUS | 2 refills | Status: DC
  Filled 2022-10-14: qty 3, 28d supply, fill #0
  Filled 2022-11-08: qty 3, 28d supply, fill #1
  Filled 2022-12-09: qty 3, 28d supply, fill #2

## 2022-10-16 ENCOUNTER — Other Ambulatory Visit (HOSPITAL_COMMUNITY): Payer: Self-pay

## 2022-10-17 ENCOUNTER — Encounter: Payer: Self-pay | Admitting: Nurse Practitioner

## 2022-10-17 ENCOUNTER — Other Ambulatory Visit: Payer: Self-pay | Admitting: Nurse Practitioner

## 2022-10-17 ENCOUNTER — Telehealth: Payer: Self-pay

## 2022-10-17 ENCOUNTER — Other Ambulatory Visit (HOSPITAL_COMMUNITY): Payer: Self-pay

## 2022-10-17 ENCOUNTER — Ambulatory Visit (INDEPENDENT_AMBULATORY_CARE_PROVIDER_SITE_OTHER): Payer: 59 | Admitting: Nurse Practitioner

## 2022-10-17 VITALS — BP 150/82 | HR 110 | Ht 67.02 in | Wt 340.1 lb

## 2022-10-17 DIAGNOSIS — F5105 Insomnia due to other mental disorder: Secondary | ICD-10-CM | POA: Diagnosis not present

## 2022-10-17 DIAGNOSIS — F409 Phobic anxiety disorder, unspecified: Secondary | ICD-10-CM | POA: Diagnosis not present

## 2022-10-17 DIAGNOSIS — F411 Generalized anxiety disorder: Secondary | ICD-10-CM

## 2022-10-17 DIAGNOSIS — K580 Irritable bowel syndrome with diarrhea: Secondary | ICD-10-CM | POA: Diagnosis not present

## 2022-10-17 DIAGNOSIS — F332 Major depressive disorder, recurrent severe without psychotic features: Secondary | ICD-10-CM

## 2022-10-17 DIAGNOSIS — F410 Panic disorder [episodic paroxysmal anxiety] without agoraphobia: Secondary | ICD-10-CM

## 2022-10-17 DIAGNOSIS — R110 Nausea: Secondary | ICD-10-CM

## 2022-10-17 DIAGNOSIS — E119 Type 2 diabetes mellitus without complications: Secondary | ICD-10-CM

## 2022-10-17 MED ORDER — BUPROPION HCL ER (XL) 300 MG PO TB24
300.0000 mg | ORAL_TABLET | Freq: Every day | ORAL | 1 refills | Status: DC
  Filled 2022-10-17: qty 90, 90d supply, fill #0

## 2022-10-17 MED ORDER — ONDANSETRON 8 MG PO TBDP
8.0000 mg | ORAL_TABLET | Freq: Three times a day (TID) | ORAL | 2 refills | Status: DC | PRN
  Filled 2022-10-17: qty 30, 10d supply, fill #0

## 2022-10-17 MED ORDER — TRAZODONE HCL 50 MG PO TABS
25.0000 mg | ORAL_TABLET | Freq: Every evening | ORAL | 1 refills | Status: AC | PRN
  Filled 2022-10-17: qty 30, 30d supply, fill #0
  Filled 2023-03-31: qty 30, 30d supply, fill #1

## 2022-10-17 MED ORDER — FLUOXETINE HCL 20 MG PO CAPS
60.0000 mg | ORAL_CAPSULE | Freq: Every day | ORAL | 1 refills | Status: DC
  Filled 2022-10-17 – 2022-12-23 (×2): qty 270, 90d supply, fill #0

## 2022-10-17 NOTE — Telephone Encounter (Signed)
Pt came by the office to drop off paperwork for school. Pt is requesting that her Mental health and Stomach issues be documented in the paperwork.  Pt states that she needs this paperwork completed ASAP knowing that the turn around time is 7-10 BUS DAYS.  Fee was paid today at drop off.   Paperwork given to [place].

## 2022-10-17 NOTE — Progress Notes (Signed)
Established patient visit   Patient: Kayla Kelly   DOB: [birth year]   22 y.o. Female  MRN: [id number] Visit Date: 10/17/2022   Chief Complaint  Patient presents with   paperwork   Subjective      HPI  Follow up  Anxiety  -moderate to severe  --limits her ability to go out of her dorm  -she would like to have disability paperwork filled out from her school so that she can live off campus.  -wants to get service dog and thi sis much more feasible living off campus.  -her college has a "mandatory" 2-year live on campus requirement. Paperwork will help her obtain the ability to live off campus and have service animal she needs.  -she does feel like mood is stable on current medication. She does need refills for these.  -having trouble sleeping due to anxiety. Cannot get her mind and thoughts   Severe IBS resulting in severe intestinal cramping -has diarrhea and nausea with vomiting.  Disability paperwork will allow her to "zoom" classes when she has a "bad belly day" and prevent her from missing class.     Medications: Outpatient Medications Prior to Visit  Medication Sig   blood glucose meter kit and supplies KIT Use to test blood sugar daily   dicyclomine (BENTYL) 20 MG tablet Take 1 tablet (20 mg total) by mouth 3 (three) times daily as needed for spasms.   etodolac (LODINE) 400 MG tablet Take 1 tablet (400 mg total) by mouth 2 (two) times daily.   fluticasone (FLONASE) 50 MCG/ACT nasal spray Place 1-2 sprays into both nostrils daily.   glucose blood test strip Use to test blood sugar daily   hydrOXYzine (VISTARIL) 25 MG capsule Take by mouth.   Lancets (FREESTYLE) lancets Use to test blood sugar daily   medroxyPROGESTERone (DEPO-PROVERA) 150 MG/ML injection Inject 150 mg into the muscle every 3 (three) months.   pantoprazole (PROTONIX) 40 MG tablet Take 1 tablet (40 mg total) by mouth 2 (two) times daily.   Semaglutide, 2 MG/DOSE, 8 MG/3ML SOPN Inject 2 mg as directed once a  week.   [DISCONTINUED] buPROPion (WELLBUTRIN XL) 150 MG 24 hr tablet Take 1-2 tablets (150-300 mg total) by mouth daily.   [DISCONTINUED] FLUoxetine (PROZAC) 20 MG capsule Take 3 capsules (60 mg total) by mouth daily.   Drospirenone (SLYND) 4 MG TABS Take 1 tablet by mouth daily. (Patient not taking: Reported on 10/17/2022)   No facility-administered medications prior to visit.    Review of Systems See HPI      Objective     Today's Vitals   10/17/22 1020 10/17/22 1102  BP: (Abnormal) 170/80 (Abnormal) 150/82  Pulse: (Abnormal) 110   SpO2: 100%   Weight: (Abnormal) 340 lb 1.9 oz (154.3 kg)   Height: 5' 7.02" (1.702 m)    Body mass index is 53.24 kg/m.  BP Readings from Last 3 Encounters:  10/17/22 (Abnormal) 150/82  05/10/22 (Abnormal) 158/90  03/27/22 (Abnormal) 130/90    Wt Readings from Last 3 Encounters:  10/17/22 (Abnormal) 340 lb 1.9 oz (154.3 kg) (>99 %, Z= 2.99)*  05/10/22 (Abnormal) 349 lb (158.3 kg) (>99 %, Z= 2.96)*  03/27/22 (Abnormal) 344 lb 6.4 oz (156.2 kg) (>99 %, Z= 2.93)*   * Growth percentiles are based on CDC (Girls, 2-20 Years) data.    Physical Exam Vitals and nursing note reviewed.  Constitutional:      Appearance: Normal appearance. She is well-developed. She is obese.  HENT:  Head: Normocephalic and atraumatic.     Nose: Nose normal.     Mouth/Throat:     Mouth: Mucous membranes are moist.     Pharynx: Oropharynx is clear.  Eyes:     Extraocular Movements: Extraocular movements intact.     Conjunctiva/sclera: Conjunctivae normal.     Pupils: Pupils are equal, round, and reactive to light.  Neck:     Vascular: No carotid bruit.  Cardiovascular:     Rate and Rhythm: Normal rate and regular rhythm.     Pulses: Normal pulses.     Heart sounds: Normal heart sounds.  Pulmonary:     Effort: Pulmonary effort is normal.     Breath sounds: Normal breath sounds.  Abdominal:     Palpations: Abdomen is soft.  Musculoskeletal:         General: Normal range of motion.     Cervical back: Normal range of motion and neck supple.  Lymphadenopathy:     Cervical: No cervical adenopathy.  Skin:    General: Skin is warm and dry.     Capillary Refill: Capillary refill takes less than 2 seconds.  Neurological:     General: No focal deficit present.     Mental Status: She is alert and oriented to person, place, and time.  Psychiatric:        Attention and Perception: Attention and perception normal.        Mood and Affect: Affect normal. Mood is anxious.        Speech: Speech normal.        Behavior: Behavior normal. Behavior is cooperative.        Thought Content: Thought content normal.        Cognition and Memory: Cognition and memory normal.        Judgment: Judgment normal.      Assessment & Plan    Insomnia due to anxiety and fear Assessment & Plan: Trial trazodone 50 mg at bedtime as needed for insomnia.   Orders: -     traZODone HCl; Take 0.5-1 tablets (25-50 mg total) by mouth at bedtime as needed for sleep.  Dispense: 30 tablet; Refill: 1  Panic disorder Assessment & Plan: Use hydroxyzine 25 mg  as needed for acute anxiety.  Continue wellbutrin XL 300 mg daily Continue fluoxetine 60 mg daily Recommend patient to have emotional support animal to help control frequency and severity of panic attacks.  -recommendation for off-campus living to be made for the patient.  -paperwork to be completed and returned to the patient.     GAD (generalized anxiety disorder) Assessment & Plan: Continue with wellbutrin XL 300 mg daily  Continue fluoxetine 60 mg daily  Take hydroxyzine 25 mg as needed for acute anxiety   Orders: -     buPROPion HCl ER (XL); Take 1 tablet (300 mg total) by mouth daily.  Dispense: 90 tablet; Refill: 1  MDD (major depressive disorder), recurrent severe, without psychosis (HCC) Assessment & Plan: Continue fluoxetine 60 mg daily  -psychiatry/therapy evaluation as indicated   Orders: -      FLUoxetine HCl; Take 3 capsules (60 mg total) by mouth daily.  Dispense: 270 capsule; Refill: 1  Irritable bowel syndrome with diarrhea Assessment & Plan: Patient currently taking Bentyl 20 mg up to three times daily as needed.  -recommendation for off-campus living to be made for the patient.  -paperwork to be completed and returned to the patient.  -GI follow up as indicated  Nausea Assessment & Plan: May take zofran 8 mg ODT up to three times daily as needed for nausea. Recommend VRAT and bland diet when needed.   Orders: -     Ondansetron; Take 1 tablet (8 mg total) by mouth every 8 (eight) hours as needed for nausea or vomiting.  Dispense: 30 tablet; Refill: 2  Type 2 diabetes mellitus without complication, without long-term current use of insulin (HCC) Assessment & Plan: Stable. Continue ozempic 2 mg weekly       Return in about 3 months (around 01/17/2023) for diabetes with HgbA1c check.         Carlean Jews, NP  [facility] [phone] (phone) [phone] (fax)  [facility]

## 2022-10-17 NOTE — Telephone Encounter (Signed)
Patient is here in office today 10/17/2022

## 2022-10-17 NOTE — Telephone Encounter (Signed)
Per Nira Conn this pt need to be seen she hasn't been here since 12/2021

## 2022-10-18 ENCOUNTER — Other Ambulatory Visit (HOSPITAL_COMMUNITY): Payer: Self-pay

## 2022-10-23 NOTE — Telephone Encounter (Signed)
Pt is calling requesting an update when paperwork will be complete.

## 2022-10-29 NOTE — Telephone Encounter (Signed)
Pt requesting an update on when Paperwork will be completed.

## 2022-11-05 NOTE — Telephone Encounter (Signed)
Patient has an open order for PUS ordered 07/09/22 for [facility].   Call placed to patient for update.  Patient states after 3 months of bleeding she went to student health at school and ultimately switched to depo provera. States  Depo has been working well, no concerns. Is unsure where she plans to return at this time for next depo, will return call to office if OV needed. Last AEX 05/10/22. Advised patient I will provide update to [facility] and return call if any additional recommendations. Will cancel PUS order at this time. Patient verbalizes understanding and is agreeable.   Routing to provider for final review. Patient is agreeable to disposition. Will close encounter.

## 2022-11-08 ENCOUNTER — Other Ambulatory Visit (HOSPITAL_COMMUNITY): Payer: Self-pay

## 2022-11-09 ENCOUNTER — Other Ambulatory Visit (HOSPITAL_COMMUNITY): Payer: Self-pay

## 2022-11-11 NOTE — Telephone Encounter (Signed)
Paperwork should be ready tomorrow.

## 2022-11-12 DIAGNOSIS — F409 Phobic anxiety disorder, unspecified: Secondary | ICD-10-CM | POA: Insufficient documentation

## 2022-11-12 NOTE — Assessment & Plan Note (Signed)
May take zofran 8 mg ODT up to three times daily as needed for nausea. Recommend VRAT and bland diet when needed.

## 2022-11-12 NOTE — Telephone Encounter (Signed)
I called pt to inform her that the paperwork was faxed and confirmation was received.

## 2022-11-12 NOTE — Assessment & Plan Note (Signed)
Continue with wellbutrin XL 300 mg daily  Continue fluoxetine 60 mg daily  Take hydroxyzine 25 mg as needed for acute anxiety

## 2022-11-12 NOTE — Assessment & Plan Note (Signed)
Patient currently taking Bentyl 20 mg up to three times daily as needed.  -recommendation for off-campus living to be made for the patient.  -paperwork to be completed and returned to the patient.  -GI follow up as indicated

## 2022-11-12 NOTE — Assessment & Plan Note (Addendum)
Use hydroxyzine 25 mg  as needed for acute anxiety.  Continue wellbutrin XL 300 mg daily Continue fluoxetine 60 mg daily Recommend patient to have emotional support animal to help control frequency and severity of panic attacks.  -recommendation for off-campus living to be made for the patient.  -paperwork to be completed and returned to the patient.

## 2022-11-12 NOTE — Assessment & Plan Note (Signed)
Trial trazodone 50 mg at bedtime as needed for insomnia.

## 2022-11-12 NOTE — Assessment & Plan Note (Signed)
Stable. Continue ozempic 2 mg weekly

## 2022-11-12 NOTE — Assessment & Plan Note (Signed)
Continue fluoxetine 60 mg daily  -psychiatry/therapy evaluation as indicated

## 2022-12-09 ENCOUNTER — Other Ambulatory Visit: Payer: Self-pay | Admitting: Nurse Practitioner

## 2022-12-09 ENCOUNTER — Other Ambulatory Visit (HOSPITAL_COMMUNITY): Payer: Self-pay

## 2022-12-09 MED ORDER — MEDROXYPROGESTERONE ACETATE 150 MG/ML IM SUSP
150.0000 mg | INTRAMUSCULAR | 1 refills | Status: DC
  Filled 2022-12-09: qty 1, 90d supply, fill #0
  Filled 2023-03-04: qty 1, 90d supply, fill #1

## 2022-12-12 ENCOUNTER — Telehealth: Payer: Self-pay

## 2022-12-12 NOTE — Telephone Encounter (Signed)
Patient called office to see if she could get a referral to GI, patient states that she is constantly Vomitting, and having stomach issues,please advise, thanks.

## 2022-12-23 ENCOUNTER — Other Ambulatory Visit (HOSPITAL_COMMUNITY): Payer: Self-pay

## 2022-12-24 NOTE — Telephone Encounter (Signed)
Hey. I see that she was seeing Dr. Orvan Falconer at [facility]. I understand that Dr. Orvan Falconer left, but the patient is still established with [facility]. She should be able to call and be scheduled without a new referral.

## 2023-01-04 ENCOUNTER — Other Ambulatory Visit: Payer: Self-pay | Admitting: Nurse Practitioner

## 2023-01-04 ENCOUNTER — Other Ambulatory Visit (HOSPITAL_COMMUNITY): Payer: Self-pay

## 2023-01-04 DIAGNOSIS — E119 Type 2 diabetes mellitus without complications: Secondary | ICD-10-CM

## 2023-01-06 ENCOUNTER — Other Ambulatory Visit (HOSPITAL_COMMUNITY): Payer: Self-pay

## 2023-01-06 MED ORDER — OZEMPIC (2 MG/DOSE) 8 MG/3ML ~~LOC~~ SOPN
2.0000 mg | PEN_INJECTOR | SUBCUTANEOUS | 2 refills | Status: DC
  Filled 2023-01-06: qty 3, 28d supply, fill #0
  Filled 2023-01-29 – 2023-02-10 (×2): qty 3, 28d supply, fill #1
  Filled 2023-03-04: qty 3, 28d supply, fill #2

## 2023-01-20 ENCOUNTER — Ambulatory Visit (INDEPENDENT_AMBULATORY_CARE_PROVIDER_SITE_OTHER): Payer: 59 | Admitting: Family Medicine

## 2023-01-20 ENCOUNTER — Other Ambulatory Visit (HOSPITAL_COMMUNITY): Payer: Self-pay

## 2023-01-20 ENCOUNTER — Encounter: Payer: Self-pay | Admitting: Family Medicine

## 2023-01-20 VITALS — BP 145/89 | HR 94 | Resp 18 | Ht 67.03 in | Wt 328.0 lb

## 2023-01-20 DIAGNOSIS — E119 Type 2 diabetes mellitus without complications: Secondary | ICD-10-CM | POA: Diagnosis not present

## 2023-01-20 DIAGNOSIS — R030 Elevated blood-pressure reading, without diagnosis of hypertension: Secondary | ICD-10-CM

## 2023-01-20 DIAGNOSIS — R110 Nausea: Secondary | ICD-10-CM | POA: Diagnosis not present

## 2023-01-20 DIAGNOSIS — R41840 Attention and concentration deficit: Secondary | ICD-10-CM

## 2023-01-20 DIAGNOSIS — Z7985 Long-term (current) use of injectable non-insulin antidiabetic drugs: Secondary | ICD-10-CM

## 2023-01-20 DIAGNOSIS — R1115 Cyclical vomiting syndrome unrelated to migraine: Secondary | ICD-10-CM | POA: Diagnosis not present

## 2023-01-20 DIAGNOSIS — F332 Major depressive disorder, recurrent severe without psychotic features: Secondary | ICD-10-CM

## 2023-01-20 DIAGNOSIS — Z1159 Encounter for screening for other viral diseases: Secondary | ICD-10-CM

## 2023-01-20 DIAGNOSIS — R1030 Lower abdominal pain, unspecified: Secondary | ICD-10-CM | POA: Diagnosis not present

## 2023-01-20 DIAGNOSIS — F411 Generalized anxiety disorder: Secondary | ICD-10-CM | POA: Diagnosis not present

## 2023-01-20 DIAGNOSIS — K580 Irritable bowel syndrome with diarrhea: Secondary | ICD-10-CM | POA: Diagnosis not present

## 2023-01-20 DIAGNOSIS — N915 Oligomenorrhea, unspecified: Secondary | ICD-10-CM | POA: Diagnosis not present

## 2023-01-20 LAB — POCT UA - MICROALBUMIN
Creatinine, POC: 200 mg/dL
Microalbumin Ur, POC: 80 mg/L

## 2023-01-20 LAB — POCT GLYCOSYLATED HEMOGLOBIN (HGB A1C): HbA1c POC (<> result, manual entry): 5.2 % (ref 4.0–5.6)

## 2023-01-20 MED ORDER — HYDROXYZINE PAMOATE 25 MG PO CAPS
25.0000 mg | ORAL_CAPSULE | Freq: Three times a day (TID) | ORAL | 2 refills | Status: DC
Start: 2023-01-20 — End: 2023-04-16
  Filled 2023-01-20: qty 30, 10d supply, fill #0

## 2023-01-20 MED ORDER — FLUOXETINE HCL 20 MG PO CAPS
60.0000 mg | ORAL_CAPSULE | Freq: Every day | ORAL | 1 refills | Status: DC
Start: 2023-01-20 — End: 2023-08-18
  Filled 2023-01-20 – 2023-03-31 (×2): qty 270, 90d supply, fill #0
  Filled 2023-07-14: qty 270, 90d supply, fill #1

## 2023-01-20 MED ORDER — ONDANSETRON 8 MG PO TBDP
8.0000 mg | ORAL_TABLET | Freq: Three times a day (TID) | ORAL | 2 refills | Status: DC | PRN
Start: 2023-01-20 — End: 2023-03-17
  Filled 2023-01-20: qty 30, 10d supply, fill #0

## 2023-01-20 MED ORDER — BUPROPION HCL ER (XL) 300 MG PO TB24
300.0000 mg | ORAL_TABLET | Freq: Every day | ORAL | 1 refills | Status: DC
Start: 2023-01-20 — End: 2023-07-18
  Filled 2023-01-20: qty 90, 90d supply, fill #0
  Filled 2023-03-31 – 2023-04-02 (×2): qty 90, 90d supply, fill #1

## 2023-01-20 MED ORDER — DICYCLOMINE HCL 20 MG PO TABS
20.0000 mg | ORAL_TABLET | Freq: Three times a day (TID) | ORAL | 2 refills | Status: DC | PRN
Start: 2023-01-20 — End: 2023-09-25
  Filled 2023-01-20: qty 90, 30d supply, fill #0
  Filled 2023-07-07: qty 90, 30d supply, fill #1

## 2023-01-20 NOTE — Assessment & Plan Note (Signed)
GAD-7 score 13.  Patient states that taking Prozac 60 mg daily and Wellbutrin 300 mg daily has been the best mental health that she has had in a long time.  She is hesitant to change anything before ruling out ADHD as a contributing factor.  We did discuss the possibility of adding a medication to help the brain regulate levels better with something like Abilify, Vraylar.  Patient would like to keep this as an option but does not want to start it at this time.  Continue hydroxyzine 25 mg as needed for acute anxiety.

## 2023-01-20 NOTE — Assessment & Plan Note (Signed)
Blood pressure elevated and remained elevated on recheck in office today.  She does have a history of several elevated blood pressures during office visits with different specialties.  At next appointment, we will discuss ambulatory blood pressure monitoring to assess for true hypertension which could potentially be contributing to increased abdomen/creatinine ratio.

## 2023-01-20 NOTE — Progress Notes (Signed)
Established Patient Office Visit  Subjective   Patient ID: Kayla Kelly, female    DOB: [birth year]  Age: 22 y.o. MRN: [id number]  Chief Complaint  Patient presents with   Diabetes    HPI Kayla Kelly is a 22 y.o. female presenting today for follow up of diabetes, mood. Denies hypoglycemic events, wounds or sores that are not healing well, increased thirst or urination. Denies vision problems, eye exam due. Taking Ozempic as prescribed without any side effects.  She feels mood has been as stable as it has been, but there is still some room for improvement.  She has noticed that she is emotionally overwhelmed often.  She does endorse difficulty concentrating which has been present for much of her life, but she has never been evaluated for ADHD.  She would be interested in doing this before making any adjustments for her psychiatric medicine. She has an upcoming appointment with gastroenterology to discuss changes in her GI symptoms.  She has been diagnosed with IBS and has suffered with diarrhea for many years now.  About a year and a half ago before she started Ozempic, she started experiencing nausea throughout the day in addition to lower abdominal pain.  About a year ago, she also started having about 2 episodes a month of vomiting right after waking up.  Even after vomiting, she would continue to have ongoing stabbing lower abdominal pain and nausea.  During these episodes, laying on her left side improves pain.  Because of this, she has also noticed a decrease in appetite.  Of note, she also had very irregular, heavy periods prior to starting birth control.  She initially started [facility] but had to discontinue due to side effects, then switch to Depo shots which have discontinued menstrual cycles entirely.  She believes that she was previously worked up for PCOS while she was living in [place] and the results were negative from what she recalls.     01/20/2023    2:15 PM 10/17/2022   10:23 AM  01/16/2022   11:03 AM  Depression screen PHQ 2/9  Decreased Interest 2 1 0  Down, Depressed, Hopeless 1 1 0  PHQ - 2 Score 3 2 0  Altered sleeping 3 3 2   Tired, decreased energy 3 3 2   Change in appetite 1 2 2   Feeling bad or failure about yourself  1 1 1   Trouble concentrating 2 2 1   Moving slowly or fidgety/restless 1 1 1   Suicidal thoughts 0 1 1  PHQ-9 Score 14 15 10   Difficult doing work/chores Somewhat difficult        01/20/2023    2:16 PM 10/17/2022   10:24 AM 01/16/2022   11:03 AM 12/18/2021   11:45 AM  GAD 7 : Generalized Anxiety Score  Nervous, Anxious, on Edge 2 2 1 1   Control/stop worrying 1 3 2 1   Worry too much - different things 3 3 1 1   Trouble relaxing 1 2 1 2   Restless 2 1 0 1  Easily annoyed or irritable 3 2 1 2   Afraid - awful might happen 1 1 0 1  Total GAD 7 Score 13 14 6 9   Anxiety Difficulty Somewhat difficult       ROS Negative unless otherwise noted in HPI   Objective:     BP (!) 145/89 (BP Location: Left Arm, Patient Position: Sitting, Cuff Size: Large)   Pulse 94   Resp 18   Ht 5' 7.03" (1.703 m)   Wt (!)  328 lb (148.8 kg)   SpO2 99%   BMI 51.33 kg/m   Physical Exam Constitutional:      General: She is not in acute distress.    Appearance: Normal appearance.  HENT:     Head: Normocephalic and atraumatic.  Cardiovascular:     Rate and Rhythm: Normal rate and regular rhythm.     Pulses: Normal pulses.     Heart sounds: No murmur heard.    No friction rub. No gallop.  Pulmonary:     Effort: Pulmonary effort is normal. No respiratory distress.     Breath sounds: No wheezing, rhonchi or rales.  Skin:    General: Skin is warm and dry.  Neurological:     Mental Status: She is alert and oriented to person, place, and time.   Diabetic Foot Exam - Simple   Simple Foot Form Diabetic Foot exam was performed with the following findings: Yes 01/20/2023  3:13 PM  Visual Inspection No deformities, no ulcerations, no other skin breakdown  bilaterally: Yes Sensation Testing Intact to touch and monofilament testing bilaterally: Yes Pulse Check Posterior Tibialis and Dorsalis pulse intact bilaterally: Yes Comments    Results for orders placed or performed in visit on 01/20/23  POCT HgB A1C  Result Value Ref Range   Hemoglobin A1C     HbA1c POC (<> result, manual entry) 5.2 4.0 - 5.6 %   HbA1c, POC (prediabetic range)     HbA1c, POC (controlled diabetic range)    POCT UA - Microalbumin  Result Value Ref Range   Microalbumin Ur, POC 80 mg/L   Creatinine, POC 200 mg/dL   Albumin/Creatinine Ratio, Urine, POC 30-300      Assessment & Plan:  Type 2 diabetes mellitus without complication, without long-term current use of insulin (HCC) Assessment & Plan: Very well-controlled, A1c today 5.2.  Foot exam within normal limits.  Albumin/creatinine ratio slightly elevated at 30-300.  We may consider rechecking this in about 3 months, and we will likely need to discuss ambulatory blood pressure monitoring to assess for hypertension outside of whitecoat hypertension.  Orders: -     POCT glycosylated hemoglobin (Hb A1C) -     POCT UA - Microalbumin  White coat syndrome without diagnosis of hypertension Assessment & Plan: Blood pressure elevated and remained elevated on recheck in office today.  She does have a history of several elevated blood pressures during office visits with different specialties.  At next appointment, we will discuss ambulatory blood pressure monitoring to assess for true hypertension which could potentially be contributing to increased abdomen/creatinine ratio.   GAD (generalized anxiety disorder) Assessment & Plan: GAD-7 score 13.  Patient states that taking Prozac 60 mg daily and Wellbutrin 300 mg daily has been the best mental health that she has had in a long time.  She is hesitant to change anything before ruling out ADHD as a contributing factor.  We did discuss the possibility of adding a medication to  help the brain regulate levels better with something like Abilify, Vraylar.  Patient would like to keep this as an option but does not want to start it at this time.  Continue hydroxyzine 25 mg as needed for acute anxiety.  Orders: -     buPROPion HCl ER (XL); Take 1 tablet (300 mg total) by mouth daily.  Dispense: 90 tablet; Refill: 1 -     hydrOXYzine Pamoate; Take 1 capsule (25 mg total) by mouth 3 (three) times daily.  Dispense: 30  capsule; Refill: 2  MDD (major depressive disorder), recurrent severe, without psychosis (HCC) Assessment & Plan: PHQ-9 score 14.  Patient states that she would like to continue her current routine for now.  After evaluation for ADHD, may discuss adding second-generation antipsychotic like Abilify or Vraylar.  Orders: -     FLUoxetine HCl; Take 3 capsules (60 mg total) by mouth daily.  Dispense: 270 capsule; Refill: 1  Concentration deficit Assessment & Plan: Never formally evaluated for ADHD.  Provided referral to [place] [facility].  Patient would like to be evaluated for ADHD before discussing other management options for GAD/MDD.  Orders: -     Ambulatory referral to Psychiatry  Irritable bowel syndrome with diarrhea Assessment & Plan: She continues to have symptoms, not well-controlled.  Continue dicyclomine 20 mg 3 times daily as needed, Zofran 8 mg 3 times daily as needed.  Upcoming appointment with GI at the end of August for further evaluation of recent change in GI symptoms.  Orders: -     Dicyclomine HCl; Take 1 tablet (20 mg total) by mouth 3 (three) times daily as needed for spasms.  Dispense: 90 tablet; Refill: 2 -     CBC with Differential/Platelet; Future -     Comprehensive metabolic panel; Future -     US PELVIS LIMITED (TRANSABDOMINAL ONLY); Future -     US Abdomen Complete; Future  Nausea -     Ondansetron; Take 1 tablet (8 mg total) by mouth every 8 (eight) hours as needed for nausea or vomiting.  Dispense: 30 tablet;  Refill: 2 -     CBC with Differential/Platelet; Future -     Comprehensive metabolic panel; Future -     US PELVIS LIMITED (TRANSABDOMINAL ONLY); Future -     US Abdomen Complete; Future  Cyclical vomiting -     CBC with Differential/Platelet; Future -     Comprehensive metabolic panel; Future -     US PELVIS LIMITED (TRANSABDOMINAL ONLY); Future -     US Abdomen Complete; Future  Lower abdominal pain -     CBC with Differential/Platelet; Future -     Comprehensive metabolic panel; Future -     US PELVIS LIMITED (TRANSABDOMINAL ONLY); Future -     US Abdomen Complete; Future  Hypomenorrhea/oligomenorrhea -     US PELVIS LIMITED (TRANSABDOMINAL ONLY); Future  Screening for viral disease -     HepB+HepC+HIV Panel; Future  Nausea/vomiting, lower abd pain, abnormal menstrual cycle Due to ongoing GI complaints that do not seem to correlate with when she started Ozempic, I agree with recommendation to follow-up with GI.  They previously started workup with CMP, CRP, celiac labs, alpha gal labs.  Workup was negative though patient does have a history of abnormal liver enzymes.  Abdominal ultrasound was ordered but was not completed at that time.  Discussed completing abdominal ultrasound prior to seeing gastroenterologist so that they have more information, patient in agreement.  Due to patient presentation of abdominal pain, nausea/vomiting, abnormal periods, type 2 diabetes, I am also still suspicious for PCOS or other structural abnormality like endometriosis or uterine fibroids.  Patient agreeable to pelvic ultrasound as long as cost is not too high.  Given history of elevated liver enzymes in the past, will also complete screening for HIV and hepatitis at this time as she is due anyway.  Return in about 3 months (around 04/22/2023) for follow-up for DM; fasting labs within the next month or so.   I  spent 55 minutes on the day of the encounter to include pre-visit record review, face-to-face  time with the patient, and post visit ordering of labs/imaging.  Melida Quitter, PA

## 2023-01-20 NOTE — Assessment & Plan Note (Signed)
She continues to have symptoms, not well-controlled.  Continue dicyclomine 20 mg 3 times daily as needed, Zofran 8 mg 3 times daily as needed.  Upcoming appointment with GI at the end of August for further evaluation of recent change in GI symptoms.

## 2023-01-20 NOTE — Assessment & Plan Note (Signed)
Very well-controlled, A1c today 5.2.  Foot exam within normal limits.  Albumin/creatinine ratio slightly elevated at 30-300.  We may consider rechecking this in about 3 months, and we will likely need to discuss ambulatory blood pressure monitoring to assess for hypertension outside of whitecoat hypertension.

## 2023-01-20 NOTE — Assessment & Plan Note (Signed)
PHQ-9 score 14.  Patient states that she would like to continue her current routine for now.  After evaluation for ADHD, may discuss adding second-generation antipsychotic like Abilify or Vraylar.

## 2023-01-20 NOTE — Assessment & Plan Note (Signed)
Never formally evaluated for ADHD.  Provided referral to [facility].  Patient would like to be evaluated for ADHD before discussing other management options for GAD/MDD.

## 2023-01-21 ENCOUNTER — Other Ambulatory Visit (HOSPITAL_COMMUNITY): Payer: Self-pay

## 2023-01-24 ENCOUNTER — Encounter: Payer: Self-pay | Admitting: Nurse Practitioner

## 2023-01-24 ENCOUNTER — Telehealth: Payer: 59 | Admitting: Nurse Practitioner

## 2023-01-24 ENCOUNTER — Other Ambulatory Visit (HOSPITAL_COMMUNITY): Payer: Self-pay

## 2023-01-24 DIAGNOSIS — J069 Acute upper respiratory infection, unspecified: Secondary | ICD-10-CM | POA: Diagnosis not present

## 2023-01-24 MED ORDER — FLUTICASONE PROPIONATE 50 MCG/ACT NA SUSP
2.0000 | Freq: Every day | NASAL | 6 refills | Status: DC
Start: 2023-01-24 — End: 2023-08-18
  Filled 2023-01-24: qty 16, 30d supply, fill #0
  Filled 2023-03-31: qty 16, 30d supply, fill #1

## 2023-01-24 MED ORDER — PROMETHAZINE-DM 6.25-15 MG/5ML PO SYRP
2.5000 mL | ORAL_SOLUTION | Freq: Four times a day (QID) | ORAL | 0 refills | Status: DC | PRN
Start: 2023-01-24 — End: 2023-03-07
  Filled 2023-01-24: qty 118, 12d supply, fill #0

## 2023-01-24 NOTE — Progress Notes (Signed)
Virtual Visit Consent   Kayla Kelly, you are scheduled for a virtual visit with a [facility] provider today. Just as with appointments in the office, your consent must be obtained to participate. Your consent will be active for this visit and any virtual visit you may have with one of our providers in the next 365 days. If you have a MyChart account, a copy of this consent can be sent to you electronically.  As this is a virtual visit, video technology does not allow for your provider to perform a traditional examination. This may limit your provider's ability to fully assess your condition. If your provider identifies any concerns that need to be evaluated in person or the need to arrange testing (such as labs, EKG, etc.), we will make arrangements to do so. Although advances in technology are sophisticated, we cannot ensure that it will always work on either your end or our end. If the connection with a video visit is poor, the visit may have to be switched to a telephone visit. With either a video or telephone visit, we are not always able to ensure that we have a secure connection.  By engaging in this virtual visit, you consent to the provision of healthcare and authorize for your insurance to be billed (if applicable) for the services provided during this visit. Depending on your insurance coverage, you may receive a charge related to this service.  I need to obtain your verbal consent now. Are you willing to proceed with your visit today? Paytin Eiser has provided verbal consent on 01/24/2023 for a virtual visit (video or telephone). Viviano Simas, FNP  Date: 01/24/2023 4:36 PM  Virtual Visit via Video Note   I, Viviano Simas, connected with  Kayla Kelly  ([id number], 03-09-2001) on 01/24/23 at  4:30 PM EDT by a video-enabled telemedicine application and verified that I am speaking with the correct person using two identifiers.  Location: Patient: Virtual Visit Location Patient: Home Provider:  Virtual Visit Location Provider: Home Office   I discussed the limitations of evaluation and management by telemedicine and the availability of in person appointments. The patient expressed understanding and agreed to proceed.    History of Present Illness: Kayla Kelly is a 22 y.o. who identifies as a female who was assigned female at birth, and is being seen today for nasal congestion, cough that is productive, sore throat, running nose, pressure in her ears, she feels war without known fever Symptom onset was 01/21/23 (4 days ago)   The cough is causing her to vomit at times  She has not taken a COVID test   She has been using Halls cough drops, mucinex and Dayquil / Nyquil  Advil as well as needed   She uses Zofran as needed for nausea and did use that yesterday but has not needed that today  She has been able to eat and drink OK today    Problems:  Patient Active Problem List   Diagnosis Date Noted   Concentration deficit 01/20/2023   White coat syndrome without diagnosis of hypertension 01/20/2023   Insomnia due to anxiety and fear 11/12/2022   Nausea 01/20/2022   Weight gain, abnormal 01/20/2022   Other fatigue 01/20/2022   Bilateral foot pain 12/18/2021   Irritable bowel syndrome with diarrhea 12/18/2021   Mixed hyperlipidemia 12/27/2020   Chronic pain of left knee 11/15/2020   Chronic left shoulder pain 11/15/2020   Vitamin D deficiency 11/15/2020   Adolescent idiopathic scoliosis of thoracic region  07/12/2019   MDD (major depressive disorder), recurrent severe, 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   GAD (generalized anxiety disorder) 07/07/2019   Suicide ideation 07/07/2019   Panic disorder 07/07/2019   Hypomenorrhea/oligomenorrhea 12/30/2018   Family history of factor V deficiency 12/30/2018   Type 2 diabetes mellitus without complication, without long-term current use of insulin (HCC) 12/30/2018   Hepatic steatosis 12/18/2018    Allergies:  Allergies  Allergen  Reactions   Benzalkonium Chloride Anaphylaxis   Neomycin-Bacitracin Zn-Polymyx Anaphylaxis   Neosporin [Bacitracin-Polymyxin B]    Medications:  Current Outpatient Medications:    blood glucose meter kit and supplies KIT, Use to test blood sugar daily, Disp: 1 each, Rfl: 0   buPROPion (WELLBUTRIN XL) 300 MG 24 hr tablet, Take 1 tablet (300 mg total) by mouth daily., Disp: 90 tablet, Rfl: 1   dicyclomine (BENTYL) 20 MG tablet, Take 1 tablet (20 mg total) by mouth 3 (three) times daily as needed for spasms., Disp: 90 tablet, Rfl: 2   etodolac (LODINE) 400 MG tablet, Take 1 tablet (400 mg total) by mouth 2 (two) times daily., Disp: 60 tablet, Rfl: 1   FLUoxetine (PROZAC) 20 MG capsule, Take 3 capsules (60 mg) by mouth daily., Disp: 270 capsule, Rfl: 1   fluticasone (FLONASE) 50 MCG/ACT nasal spray, Place 1-2 sprays into both nostrils daily., Disp: 16 g, Rfl: 0   glucose blood test strip, Use to test blood sugar daily, Disp: 100 each, Rfl: 0   hydrOXYzine (VISTARIL) 25 MG capsule, Take 1 capsule (25 mg total) by mouth 3 (three) times daily., Disp: 30 capsule, Rfl: 2   Lancets (FREESTYLE) lancets, Use to test blood sugar daily, Disp: 100 each, Rfl: 0   medroxyPROGESTERone (DEPO-PROVERA) 150 MG/ML injection, Inject 1 mL (150 mg total) into the muscle every 3 months., Disp: 1 mL, Rfl: 1   ondansetron (ZOFRAN-ODT) 8 MG disintegrating tablet, Take 1 tablet (8 mg total) by mouth every 8 (eight) hours as needed for nausea or vomiting., Disp: 30 tablet, Rfl: 2   pantoprazole (PROTONIX) 40 MG tablet, Take 1 tablet (40 mg total) by mouth 2 (two) times daily., Disp: 60 tablet, Rfl: 5   Semaglutide, 2 MG/DOSE, (OZEMPIC, 2 MG/DOSE,) 8 MG/3ML SOPN, Inject 2 mg as directed once a week., Disp: 3 mL, Rfl: 2   traZODone (DESYREL) 50 MG tablet, Take 0.5-1 tablets (25-50 mg total) by mouth at bedtime as needed for sleep., Disp: 30 tablet, Rfl: 1  Observations/Objective: Patient is well-developed, well-nourished in no  acute distress.  Resting comfortably  at home.  Head is normocephalic, atraumatic.  No labored breathing.  Speech is clear and coherent with logical content.  Patient is alert and oriented at baseline.    Assessment and Plan: 1. Viral upper respiratory tract infection  Advised to stop mucinex may continue to use Halls  Push fluids  Cool air humidifier   - promethazine-dextromethorphan (PROMETHAZINE-DM) 6.25-15 MG/5ML syrup; Take 2.5 mLs by mouth 4 (four) times daily as needed for cough.  Dispense: 118 mL; Refill: 0 - fluticasone (FLONASE) 50 MCG/ACT nasal spray; Place 2 sprays into both nostrils daily.  Dispense: 16 g; Refill: 6     Follow Up Instructions: I discussed the assessment and treatment plan with the patient. The patient was provided an opportunity to ask questions and all were answered. The patient agreed with the plan and demonstrated an understanding of the instructions.  A copy of instructions were sent to the patient via MyChart unless otherwise noted below.  The patient was advised to call back or seek an in-person evaluation if the symptoms worsen or if the condition fails to improve as anticipated.  Time:  I spent 15 minutes with the patient via telehealth technology discussing the above problems/concerns.    Viviano Simas, FNP

## 2023-02-05 ENCOUNTER — Ambulatory Visit
Admission: RE | Admit: 2023-02-05 | Discharge: 2023-02-05 | Disposition: A | Discharge status: Home / Self Care | Payer: 59 | Source: Ambulatory Visit | Attending: Family Medicine | Admitting: Family Medicine

## 2023-02-05 DIAGNOSIS — N915 Oligomenorrhea, unspecified: Secondary | ICD-10-CM

## 2023-02-05 DIAGNOSIS — K580 Irritable bowel syndrome with diarrhea: Secondary | ICD-10-CM

## 2023-02-05 DIAGNOSIS — R1115 Cyclical vomiting syndrome unrelated to migraine: Secondary | ICD-10-CM

## 2023-02-05 DIAGNOSIS — R110 Nausea: Secondary | ICD-10-CM

## 2023-02-05 DIAGNOSIS — R112 Nausea with vomiting, unspecified: Secondary | ICD-10-CM | POA: Diagnosis not present

## 2023-02-05 DIAGNOSIS — R1030 Lower abdominal pain, unspecified: Secondary | ICD-10-CM

## 2023-02-05 DIAGNOSIS — R109 Unspecified abdominal pain: Secondary | ICD-10-CM | POA: Diagnosis not present

## 2023-02-05 DIAGNOSIS — N939 Abnormal uterine and vaginal bleeding, unspecified: Secondary | ICD-10-CM | POA: Diagnosis not present

## 2023-02-06 ENCOUNTER — Encounter: Payer: Self-pay | Admitting: Family Medicine

## 2023-02-06 ENCOUNTER — Other Ambulatory Visit (HOSPITAL_COMMUNITY): Payer: Self-pay

## 2023-02-10 ENCOUNTER — Other Ambulatory Visit (HOSPITAL_COMMUNITY): Payer: Self-pay

## 2023-02-26 ENCOUNTER — Other Ambulatory Visit: Payer: 59

## 2023-03-04 ENCOUNTER — Other Ambulatory Visit (HOSPITAL_COMMUNITY): Payer: Self-pay

## 2023-03-05 ENCOUNTER — Other Ambulatory Visit (HOSPITAL_COMMUNITY): Payer: Self-pay

## 2023-03-07 ENCOUNTER — Encounter: Payer: Self-pay | Admitting: Gastroenterology

## 2023-03-07 ENCOUNTER — Other Ambulatory Visit (INDEPENDENT_AMBULATORY_CARE_PROVIDER_SITE_OTHER): Payer: 59

## 2023-03-07 ENCOUNTER — Ambulatory Visit (INDEPENDENT_AMBULATORY_CARE_PROVIDER_SITE_OTHER): Payer: 59 | Admitting: Gastroenterology

## 2023-03-07 ENCOUNTER — Other Ambulatory Visit (HOSPITAL_COMMUNITY): Payer: Self-pay

## 2023-03-07 VITALS — BP 140/90 | HR 104 | Ht 67.6 in | Wt 324.2 lb

## 2023-03-07 DIAGNOSIS — R110 Nausea: Secondary | ICD-10-CM

## 2023-03-07 DIAGNOSIS — K529 Noninfective gastroenteritis and colitis, unspecified: Secondary | ICD-10-CM

## 2023-03-07 DIAGNOSIS — R109 Unspecified abdominal pain: Secondary | ICD-10-CM | POA: Diagnosis not present

## 2023-03-07 DIAGNOSIS — R112 Nausea with vomiting, unspecified: Secondary | ICD-10-CM

## 2023-03-07 DIAGNOSIS — R161 Splenomegaly, not elsewhere classified: Secondary | ICD-10-CM

## 2023-03-07 LAB — CBC WITH DIFFERENTIAL/PLATELET
Basophils Absolute: 0.1 10*3/uL (ref 0.0–0.1)
Basophils Relative: 0.5 % (ref 0.0–3.0)
Eosinophils Absolute: 0.1 10*3/uL (ref 0.0–0.7)
Eosinophils Relative: 0.9 % (ref 0.0–5.0)
HCT: 40.1 % (ref 36.0–49.0)
Hemoglobin: 13.4 g/dL (ref 12.0–16.0)
Lymphocytes Relative: 26.3 % (ref 24.0–48.0)
Lymphs Abs: 3.1 10*3/uL (ref 0.7–4.0)
MCHC: 33.4 g/dL (ref 31.0–37.0)
MCV: 82.3 fl (ref 78.0–98.0)
Monocytes Absolute: 0.6 10*3/uL (ref 0.1–1.0)
Monocytes Relative: 5 % (ref 3.0–12.0)
Neutro Abs: 7.9 10*3/uL — ABNORMAL HIGH (ref 1.4–7.7)
Neutrophils Relative %: 67.3 % (ref 43.0–71.0)
Platelets: 309 10*3/uL (ref 150.0–575.0)
RBC: 4.87 Mil/uL (ref 3.80–5.70)
RDW: 13.7 % (ref 11.4–15.5)
WBC: 11.8 10*3/uL (ref 4.5–13.5)

## 2023-03-07 LAB — COMPREHENSIVE METABOLIC PANEL
ALT: 11 U/L (ref 0–35)
AST: 11 U/L (ref 0–37)
Albumin: 4.5 g/dL (ref 3.5–5.2)
Alkaline Phosphatase: 55 U/L (ref 47–119)
BUN: 12 mg/dL (ref 6–23)
CO2: 24 mEq/L (ref 19–32)
Calcium: 9.7 mg/dL (ref 8.4–10.5)
Chloride: 102 mEq/L (ref 96–112)
Creatinine, Ser: 0.71 mg/dL (ref 0.40–1.20)
GFR: 122.97 mL/min (ref >60.00)
Glucose, Bld: 99 mg/dL (ref 70–99)
Potassium: 3.9 mEq/L (ref 3.5–5.1)
Sodium: 134 mEq/L — ABNORMAL LOW (ref 135–145)
Total Bilirubin: 0.5 mg/dL (ref 0.2–1.2)
Total Protein: 7.6 g/dL (ref 6.0–8.3)

## 2023-03-07 MED ORDER — OMEPRAZOLE 20 MG PO CPDR
20.0000 mg | DELAYED_RELEASE_CAPSULE | Freq: Every day | ORAL | 3 refills | Status: DC
  Filled 2023-03-07: qty 30, 30d supply, fill #0

## 2023-03-07 NOTE — Progress Notes (Signed)
HPI :  22 year old female here for a follow-up visit for numerous GI complaints to include nausea with vomiting, abdominal pain, loose stools.  She was seen by our practice in September 2023 by Dr. Orvan Falconer, I am assuming her care at this point in our office.  This is my first time meeting her.  I went through her history in detail today.  She has had some loose stools and occasional nausea with vomiting for many years now, however majority of her symptoms in terms of frequency and how much it bothers her have been ongoing since 2022 or so.  She feels intermittent nausea with occasional vomiting.  She describes feeling nauseated usually at least a few times per week, roughly 3-4 times per week, and can occasionally vomit.  If she vomits it typically happens in the morning when she wakes up.  Vomiting can be intense, usually at least a few times per month.  She does feel nausea at baseline, can often happen after she eats.  She tries to eat slowly to prevent from "feeling sick".  She states sometimes she is limited in what she eats and has to "live on protein shakes" to make her symptoms feel better.  She states when she does this she can lose 5 pounds very quickly within a week.  She does take Ozempic for elevated glucose levels and weight loss.  She has been on this for the past year although is adamant her nausea and vomiting was occurring to some extent before she went on Ozempic.  She does have some occasional reflux for which she uses Tums at times.  She endorses abdominal pains which she can often feel in the mid abdomen but these can be "all over" at times, he with a stabbing pain.  She states her pains are worse when she has a "flareup", which can last for 2 to 6 days at a time or so.  Her pain can come and go.  Most days in the week she will feel some pain, perhaps 4 to 5 days within a week.  It can last anywhere from hours to all day.  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  She does get some relief with a bowel movement in regards to her pain but she does not have any clear pattern or triggers to what makes her pain better or worse.  She reports she has always had increased stool frequency.  On a good day she will go about 3 times a day, sometimes she will have 4-5 bowel movements per day which can occasionally be loose and watery.  She does have some form to it at times.  No blood in her stools.  She will have 4-5 loose stools per day roughly 3 to 4 days out of a week.  She has some urgency with this.  She was seen by Dr. Orvan Falconer last September, she was given some pantoprazole 40 mg twice daily, it is unclear if she took that or not.  Basic labs were done, normal liver enzymes and renal function.  She tested negative for alpha gal and celiac disease.  CRP normal.  Stool study for fecal calprotectin, pancreatic fecal elastase, H. pylori was also recommended but she was not able to do it as she had to go back to school.  She takes Zofran as needed for nausea which really does help her  but she takes it perhaps only once a week, she is not sure how frequently she should be taking it.  She also takes dicyclomine which she states also really helps her as well, she takes it up to 4 times per week.  She states if she takes both of them together to like magic and her symptoms will all get better.  She denies any NSAID use, loading is written in her chart but she does not take it.  She has been on Ozempic, she is lost about 35 pounds on the regimen.  She states her hemoglobin A1c has improved and is down to 5.2.  For her mental health she takes Prozac, Wellbutrin, hydroxyzine for panic attacks, trazodone for sleep. BMI is currently 49 today.  She had an abdominal ultrasound done in July which showed upper limits of normal parenchymal echogenicity.  Gallbladder normal.  Spleen mildly increased in size.  No cause for pain  otherwise.  Labs 01/16/2022 including normal CBC and CMP, hemoglobin A1c 5.9, TSH 3.080, normal lipase and a low amylase.  ALT 25.    US abdomen 02/06/23: IMPRESSION: 1. No sonographic etiology for abdominal pain is identified. 2. Mild splenomegaly.   Past Medical History:  Diagnosis Date   Anxiety    Blood clots in brain 2022   Depression 2018   IBS (irritable bowel syndrome) 2023   Obesity    Pneumonia    Pre-diabetes 2021     History reviewed. No pertinent surgical history. Family History  Problem Relation Age of Onset   Heart attack Mother    High Cholesterol Mother    High blood pressure Mother    Clotting disorder Mother    High Cholesterol Father    High blood pressure Father    Irritable bowel syndrome Father    Cancer - Cervical Maternal Grandmother    Cancer - Colon Maternal Grandmother    Social History   Tobacco Use   Smoking status: Never    Passive exposure: Current   Smokeless tobacco: Never  Vaping Use   Vaping status: Never Used  Substance Use Topics   Alcohol use: Never   Drug use: Yes    Types: Marijuana   Current Outpatient Medications  Medication Sig Dispense Refill   blood glucose meter kit and supplies KIT Use to test blood sugar daily 1 each 0   buPROPion (WELLBUTRIN XL) 300 MG 24 hr tablet Take 1 tablet (300 mg total) by mouth daily. 90 tablet 1   dicyclomine (BENTYL) 20 MG tablet Take 1 tablet (20 mg total) by mouth 3 (three) times daily as needed for spasms. 90 tablet 2   etodolac (LODINE) 400 MG tablet Take 1 tablet (400 mg total) by mouth 2 (two) times daily. (Patient taking differently: Take 400 mg by mouth as needed.) 60 tablet 1   FLUoxetine (PROZAC) 20 MG capsule Take 3 capsules (60 mg) by mouth daily. 270 capsule 1   fluticasone (FLONASE) 50 MCG/ACT nasal spray Place 2 sprays into both nostrils daily. (Patient taking differently: Place 2 sprays into both nostrils as needed.) 16 g 6   glucose blood test strip Use to test blood  sugar daily 100 each 0   hydrOXYzine (VISTARIL) 25 MG capsule Take 1 capsule (25 mg total) by mouth 3 (three) times daily. 30 capsule 2   Lancets (FREESTYLE) lancets Use to test blood sugar daily 100 each 0   medroxyPROGESTERone (DEPO-PROVERA) 150 MG/ML injection Inject 1 mL (150 mg total) into the muscle  every 3 months. 1 mL 1   omeprazole (PRILOSEC) 20 MG capsule Take 1 capsule (20 mg total) by mouth daily. 30 capsule 3   ondansetron (ZOFRAN-ODT) 8 MG disintegrating tablet Take 1 tablet (8 mg total) by mouth every 8 (eight) hours as needed for nausea or vomiting. 30 tablet 2   Semaglutide, 2 MG/DOSE, (OZEMPIC, 2 MG/DOSE,) 8 MG/3ML SOPN Inject 2 mg as directed once a week. 3 mL 2   traZODone (DESYREL) 50 MG tablet Take 0.5-1 tablets (25-50 mg total) by mouth at bedtime as needed for sleep. 30 tablet 1   No current facility-administered medications for this visit.   Allergies  Allergen Reactions   Benzalkonium Chloride Anaphylaxis   Neomycin-Bacitracin Zn-Polymyx Anaphylaxis   Neosporin [Bacitracin-Polymyxin B]      Review of Systems: All systems reviewed and negative except where noted in HPI.    US PELVIS (TRANSABDOMINAL ONLY)  Result Date: 02/06/2023 CLINICAL DATA:  cyclical nausea/vomiting, lower abdominal pain, abnormal menstrual cycle, r/o PCOS EXAM: TRANSABDOMINAL ULTRASOUND OF PELVIS TECHNIQUE: Transabdominal ultrasound examination of the pelvis was performed including evaluation of the uterus, ovaries, adnexal regions, and pelvic cul-de-sac. COMPARISON:  None Available. FINDINGS: Evaluation is limited by transabdominal technique. Uterus Measurements: 7.5 x 3.8 x 3.6 cm = volume: 54 mL. No fibroids or other mass visualized. Endometrium Thickness: 3 mm. No focal abnormality visualized. Evaluation is limited by transabdominal technique. Right ovary Measurements: 3.5 x 2.5 x 2.4 cm = volume: 11 mL. Normal appearance/no adnexal mass. Left ovary Measurements: 3.7 x 2.2 x 2.1 cm = volume:  9 mL. Normal appearance/no adnexal mass. Other findings:  No abnormal free fluid. IMPRESSION: Evaluation is limited by transabdominal technique. Within this limitation, no sonographic etiology for pelvic pain is identified. Electronically Signed   By: Meda Klinefelter M.D.   On: 02/06/2023 14:14    Physical Exam: BP (!) 140/90 (BP Location: Left Wrist, Patient Position: Sitting, Cuff Size: Normal)   Pulse (!) 104   Ht 5' 7.6" (1.717 m) Comment: height measured without shoes  Wt (!) 324 lb 4 oz (147.1 kg)   BMI 49.89 kg/m  Constitutional: Pleasant,well-developed, female in no acute distress. Abdominal: Soft, protuberant, nondistended, nontender, could not reproduce pain with palpation.  There are no masses palpable.  Neurological: Alert and oriented to person place and time. Skin: Skin is warm and dry. No rashes noted. Psychiatric: Normal mood and affect. Behavior is normal.   ASSESSMENT: 22 y.o. female here for assessment of the following  1. Nausea and vomiting, unspecified vomiting type   2. Abdominal pain, unspecified abdominal location   3. Chronic diarrhea   4. Splenomegaly   5. Morbid obesity (HCC)    I had an extensive discussion with the patient today about her symptoms.  She is adamant they started before she began Ozempic, however has been doing worse over the past year.  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  Her labs and ultrasound generally look okay without anything to clearly cause her symptoms.  Her spleen is mildly enlarged of unclear significance, she has no known liver disease.  Will check a CBC to make sure her hemoglobin remains normal in this light and if so, may do a follow-up imaging study in 1 year to reassess her spleen size.  Discussed differential diagnosis for her symptoms.  It is quite possible she has a functional bowel disorder that is made worse with Ozempic, however given her persistent nausea  and vomiting  especially with it starting prior to using this, I do think she warrants an upper endoscopy to clear her upper tract.  We discussed what this is, risks and benefits of the procedure and anesthesia.  Her BMI is calculated at 49 today, she is otherwise healthy and I think we can proceed with EGD at the office setting as long as the BMI is less than 50.  We will try to coordinate this around her school schedule.  Otherwise we will have her go to the lab for fecal calprotectin, H. pylori stool antigen test.  If the fecal calprotectin is positive, then would add on a colonoscopy to ensure no evidence of IBD.  If the fecal calprotectin is normal we will await her course with medical management first to consider colonoscopy pending her course if symptoms persist.  While we are waiting to do her endoscopy I recommend starting omeprazole 20 mg every day for 30-day trial if she did not try other PPI in the past yet.  Zofran seems to provide significant benefit, I recommend she take this once in the morning and once later in the afternoon to see if this will reduce her upper tract symptoms.  I encouraged her to use this to allow her to function better and go to class etc.  Likewise, Bentyl really improves her abdominal pain and her bowel habits, I recommend she take this twice daily and can take a third dose as needed.  If her symptoms improve with medical management, hopefully we can hold off on colonoscopy.  If her diarrhea persist despite using dicyclomine then she can add Imodium to use as needed.  We discussed her Ozempic use for a bit.  She really wants to continue with this as it has helped with her weight, however if we can't get her symptoms under control with medication, we really may need to consider stopping this or transitioning to a different option, as I explained to her it can cause gastroparesis and contribute/make her baseline symptoms much worse.  She understands, will continue for now, however again  if she really cannot function despite medicines we are giving her, I recommend she stop it.  I reviewed her ultrasound findings, mildly enlarged spleen, otherwise question increased echogenicity and fatty liver, her liver enzymes are normal.  Again we will check a CBC and perform interval imaging of her spleen over time, will do ultrasound 1 year from her last exam.  I suspect this is an incidental finding and likely not related to her symptoms but again will await her course.  If her workup does not show any clear pathology to cause her symptoms, can consider other modalities to treat for functional bowel disorder and consider cross sectional imaging of her abdomen.  Ideally I think a TCA or Cymbalta would be a good option for her however this would not go well with the rest of her mental health regimen and she states that current regimen works quite well for her  PLAN: - lab today - CBC, CMET - stool for fecal calprotectin and H pylori antigen - start omeprazole 20mg  / day for 30 day trial  - use Zofran every day - once to twice daily, titrate as needed - use bentyl at least BID, titrate as needed - schedule EGD in the [facility] - if symptoms persist despite medical therapy, I recommend she hold / stop Ozempic - discussed possible CT scan pending her course if symptoms persist - will need repeat US  in one year - may add immodium PRN if needed if bentyl does not control her bowels - may need colonoscopy pending her course and stool test  I spent 70 minutes of time, including in depth chart review, face-to-face time with the patient, coordinating care, and documentation.  Harlin Rain, MD [facility]

## 2023-03-07 NOTE — Patient Instructions (Addendum)
Please go to the lab in the basement of our building to have lab work done as you leave today. Hit "B" for basement when you get on the elevator.  When the doors open the lab is on your left.  We will call you with the results. Thank you.  We have sent the following medications to your pharmacy for you to pick up at your convenience:  Omeprazole 20 ZO:XWRU daily  Use Zofran once to twice a day as needed  Use Bentyl at least two times a day.  You have been scheduled for an endoscopy. Please follow written instructions given to you at your visit today. If you use inhalers (even only as needed), please bring them with you on the day of your procedure. If you take any of the following medications, they will need to be adjusted prior to your procedure:   DO NOT TAKE 7 DAYS PRIOR TO TEST- Trulicity (dulaglutide) Ozempic, Wegovy (semaglutide) Mounjaro (tirzepatide) Bydureon Bcise (exanatide extended release)  DO NOT TAKE 1 DAY PRIOR TO YOUR TEST Rybelsus (semaglutide) Adlyxin (lixisenatide) Victoza (liraglutide) Byetta (exanatide) ___________________________________________________________________________  Kayla Kelly will be due for a abdominal ultrasound in 1 year.  Thank you for entrusting me with your care and for choosing [facility], Dr. Ileene Patrick    If your blood pressure at your visit was 140/90 or greater, please contact your primary care physician to follow up on this. ______________________________________________________  If you are age 65 or older, your body mass index should be between 23-30. Your Body mass index is 49.89 kg/m. If this is out of the aforementioned range listed, please consider follow up with your Primary Care Provider.  If you are age 76 or younger, your body mass index should be between 19-25. Your Body mass index is 49.89 kg/m. If this is out of the aformentioned range listed, please consider follow up with your Primary Care Provider.   ________________________________________________________  The [facility] providers would like to encourage you to use [facility] to communicate with providers for non-urgent requests or questions.  Due to long hold times on the telephone, sending your provider a message by [facility] may be a faster and more efficient way to get a response.  Please allow 48 business hours for a response.  Please remember that this is for non-urgent requests.  _______________________________________________________  Due to recent changes in healthcare laws, you may see the results of your imaging and laboratory studies on MyChart before your provider has had a chance to review them.  We understand that in some cases there may be results that are confusing or concerning to you. Not all laboratory results come back in the same time frame and the provider may be waiting for multiple results in order to interpret others.  Please give [place] 48 hours in order for your provider to thoroughly review all the results before contacting the office for clarification of your results.

## 2023-03-08 ENCOUNTER — Other Ambulatory Visit (HOSPITAL_COMMUNITY): Payer: Self-pay

## 2023-03-10 LAB — HELICOBACTER PYLORI  SPECIAL ANTIGEN
MICRO NUMBER:: 15341867
SPECIMEN QUALITY: ADEQUATE

## 2023-03-12 LAB — CALPROTECTIN, FECAL: Calprotectin, Fecal: 32 ug/g (ref 0–120)

## 2023-03-13 ENCOUNTER — Ambulatory Visit: Payer: 59 | Admitting: Gastroenterology

## 2023-03-17 MED ORDER — ONDANSETRON 8 MG PO TBDP
8.0000 mg | ORAL_TABLET | Freq: Three times a day (TID) | ORAL | 0 refills | Status: DC | PRN
Start: 2023-03-17 — End: 2023-07-07

## 2023-03-27 ENCOUNTER — Encounter: Payer: Self-pay | Admitting: Gastroenterology

## 2023-03-27 ENCOUNTER — Encounter: Payer: Self-pay | Admitting: Family Medicine

## 2023-03-27 ENCOUNTER — Other Ambulatory Visit (HOSPITAL_COMMUNITY): Payer: Self-pay

## 2023-03-27 DIAGNOSIS — F411 Generalized anxiety disorder: Secondary | ICD-10-CM

## 2023-03-27 MED ORDER — PROPRANOLOL HCL 20 MG PO TABS
20.0000 mg | ORAL_TABLET | Freq: Three times a day (TID) | ORAL | 1 refills | Status: DC
  Filled 2023-03-27: qty 10, 4d supply, fill #0

## 2023-03-28 ENCOUNTER — Telehealth: Payer: Self-pay | Admitting: *Deleted

## 2023-03-28 NOTE — Telephone Encounter (Signed)
Thanks John. Jan I recall this patient and we measured her height in the office that day and updated it. I do think her BMI is truly < 50 otherwise would not have scheduled it. Jan can you confirm? thanks

## 2023-03-28 NOTE — Telephone Encounter (Signed)
Dr. Adela Lank,  This pt is scheduled with you on 9/13.  Her BMI is 49.9, but she has consistently been over 50.  Her height was measured consistently at 67 in, but on 03/07/23 it was measured at 67.6.  This increase in height lowered her BMI to <50.  Just wanted to verify this height is correct; trying to avoid a cancellation on the day of her procedure.  Thanks,  Cathlyn Parsons

## 2023-03-28 NOTE — Telephone Encounter (Signed)
The height is correct Sir. I confirmed with Sophia and she always makes the patient take off their shoes Sir.

## 2023-03-31 ENCOUNTER — Other Ambulatory Visit (HOSPITAL_COMMUNITY): Payer: Self-pay

## 2023-03-31 ENCOUNTER — Encounter: Payer: Self-pay | Admitting: Family Medicine

## 2023-03-31 ENCOUNTER — Other Ambulatory Visit: Payer: Self-pay | Admitting: Nurse Practitioner

## 2023-03-31 DIAGNOSIS — E119 Type 2 diabetes mellitus without complications: Secondary | ICD-10-CM

## 2023-03-31 DIAGNOSIS — E782 Mixed hyperlipidemia: Secondary | ICD-10-CM

## 2023-03-31 DIAGNOSIS — R61 Generalized hyperhidrosis: Secondary | ICD-10-CM

## 2023-03-31 MED ORDER — OZEMPIC (2 MG/DOSE) 8 MG/3ML ~~LOC~~ SOPN
2.0000 mg | PEN_INJECTOR | SUBCUTANEOUS | 2 refills | Status: DC
  Filled 2023-03-31: qty 3, 28d supply, fill #0
  Filled 2023-04-30: qty 3, 28d supply, fill #1
  Filled 2023-05-27: qty 3, 28d supply, fill #2

## 2023-04-02 ENCOUNTER — Other Ambulatory Visit (HOSPITAL_COMMUNITY): Payer: Self-pay

## 2023-04-04 ENCOUNTER — Other Ambulatory Visit: Payer: 59

## 2023-04-04 ENCOUNTER — Ambulatory Visit (AMBULATORY_SURGERY_CENTER): Payer: 59 | Admitting: Gastroenterology

## 2023-04-04 ENCOUNTER — Encounter: Payer: Self-pay | Admitting: Gastroenterology

## 2023-04-04 VITALS — BP 134/76 | HR 79 | Temp 98.6°F | Resp 9 | Ht 67.0 in | Wt 313.4 lb

## 2023-04-04 DIAGNOSIS — R61 Generalized hyperhidrosis: Secondary | ICD-10-CM

## 2023-04-04 DIAGNOSIS — E119 Type 2 diabetes mellitus without complications: Secondary | ICD-10-CM | POA: Diagnosis not present

## 2023-04-04 DIAGNOSIS — F419 Anxiety disorder, unspecified: Secondary | ICD-10-CM | POA: Diagnosis not present

## 2023-04-04 DIAGNOSIS — Z1159 Encounter for screening for other viral diseases: Secondary | ICD-10-CM | POA: Diagnosis not present

## 2023-04-04 DIAGNOSIS — R109 Unspecified abdominal pain: Secondary | ICD-10-CM | POA: Diagnosis not present

## 2023-04-04 DIAGNOSIS — R112 Nausea with vomiting, unspecified: Secondary | ICD-10-CM

## 2023-04-04 DIAGNOSIS — E782 Mixed hyperlipidemia: Secondary | ICD-10-CM

## 2023-04-04 MED ORDER — SODIUM CHLORIDE 0.9 % IV SOLN: 500.0000 mL | Freq: Once | INTRAVENOUS | Status: AC

## 2023-04-04 NOTE — Progress Notes (Signed)
Sedate, gd SR, tolerated procedure well, VSS, report to RN 

## 2023-04-04 NOTE — Progress Notes (Signed)
History and Physical Interval Note: seen on 03/07/23 - at [facility] today for EGD to evaluate these symptoms. No interval changes. Now on omeprazole 20mg  / day, Zofran, bentyl. She does not reliably take the medication. Also on Ozempic at baseline, has been held for this exam. She wishes to proceed with EGD after discussion of risks / benefits.   04/04/2023 1:47 PM  Kayla Kelly  has presented today for endoscopic procedure(s), with the diagnosis of  Encounter Diagnoses  Name Primary?   Nausea and vomiting, unspecified vomiting type Yes   Abdominal pain, unspecified abdominal location   .  The various methods of evaluation and treatment have been discussed with the patient and/or family. After consideration of risks, benefits and other options for treatment, the patient has consented to  the endoscopic procedure(s).   The patient's history has been reviewed, patient examined, no change in status, stable for surgery.  I have reviewed the patient's chart and labs.  Questions were answered to the patient's satisfaction.    Harlin Rain, MD [facility]

## 2023-04-04 NOTE — Progress Notes (Signed)
Pt's states no medical or surgical changes since previsit or office visit. 

## 2023-04-04 NOTE — Progress Notes (Signed)
Patient came to recovery  and stated that she "wants to sleep all day."  1422  Pt states stomach pain of #6.Marland Kitchen Levsin given per order of Sr Armbruster.  1432  Patient states that her pain is a #2.

## 2023-04-04 NOTE — Progress Notes (Signed)
Called to room to assist during endoscopic procedure.  Patient ID and intended procedure confirmed with present staff. Received instructions for my participation in the procedure from the performing physician.  

## 2023-04-04 NOTE — Patient Instructions (Addendum)
Resume all of your previous medications.  Be sure to take your omeprazole half an hour before eating.  It won't work on a full stomach.  Read the instructions and handouts given to you by your recovery room nurse.  If the biopsies are positive, we will call your.  You will need antibiotics.  If you do need them, be sure to take all of them.  YOU HAD AN ENDOSCOPIC PROCEDURE TODAY AT THE [facility]:   Refer to the procedure report that was given to you for any specific questions about what was found during the examination.  If the procedure report does not answer your questions, please call your gastroenterologist to clarify.  If you requested that your care partner not be given the details of your procedure findings, then the procedure report has been included in a sealed envelope for you to review at your convenience later.  YOU SHOULD EXPECT: Some feelings of bloating in the abdomen. Passage of more gas than usual.   Please Note:  You might notice some irritation and congestion in your nose or some drainage.  This is from the oxygen used during your procedure.  There is no need for concern and it should clear up in a day or so.  SYMPTOMS TO REPORT IMMEDIATELY:  Following upper endoscopy (EGD)  Vomiting of blood or coffee ground material  New chest pain or pain under the shoulder blades  Painful or persistently difficult swallowing  New shortness of breath  Fever of 100F or higher  Black, tarry-looking stools  For urgent or emergent issues, a gastroenterologist can be reached at any hour by calling [phone]. Do not use MyChart messaging for urgent concerns.    DIET:  We do recommend a small meal at first, but then you may proceed to your regular diet.  Drink plenty of fluids but you should avoid alcoholic beverages for 24 hours.  ACTIVITY:  You should plan to take it easy for the rest of today and you should NOT DRIVE or use heavy machinery until tomorrow (because of the  sedation medicines used during the test).    FOLLOW UP: Our staff will call the number listed on your records the next business day following your procedure.  We will call around 7:15- 8:00 am to check on you and address any questions or concerns that you may have regarding the information given to you following your procedure. If we do not reach you, we will leave a message.     If any biopsies were taken you will be contacted by phone or by letter within the next 1-3 weeks.  Please call us at [phone] if you have not heard about the biopsies in 3 weeks.    SIGNATURES/CONFIDENTIALITY: You and/or your care partner have signed paperwork which will be entered into your electronic medical record.  These signatures attest to the fact that that the information above on your After Visit Summary has been reviewed and is understood.  Full responsibility of the confidentiality of this discharge information lies with you and/or your care-partner.

## 2023-04-04 NOTE — Op Note (Signed)
[facility] Patient Name: Kayla Kelly Procedure Date: 04/04/2023 1:43 PM MRN: [id number] Endoscopist: Viviann Spare P. Adela Lank , MD, [id number] Age: 22 Referring MD:  Date of Birth: 19-Jul-2001 Gender: Female Account #: [id number] Procedure:                Upper GI endoscopy Indications:              intermittent nausea / vomiting, abdominal pain Medicines:                Monitored Anesthesia Care Procedure:                Pre-Anesthesia Assessment:                           - Prior to the procedure, a History and Physical                            was performed, and patient medications and                            allergies were reviewed. The patient's tolerance of                            previous anesthesia was also reviewed. The risks                            and benefits of the procedure and the sedation                            options and risks were discussed with the patient.                            All questions were answered, and informed consent                            was obtained. Prior Anticoagulants: The patient has                            taken no anticoagulant or antiplatelet agents. ASA                            Grade Assessment: III - A patient with severe                            systemic disease. After reviewing the risks and                            benefits, the patient was deemed in satisfactory                            condition to undergo the procedure.                           After obtaining informed consent, the endoscope was  passed under direct vision. Throughout the                            procedure, the patient's blood pressure, pulse, and                            oxygen saturations were monitored continuously. The                            GIF [id number] #[id number] was introduced through the                            mouth, and advanced to the second part of duodenum.                             The upper GI endoscopy was accomplished without                            difficulty. The patient tolerated the procedure                            well. Scope In: Scope Out: Findings:                 Esophagogastric landmarks were identified: the                            Z-line was found at 42 cm, the gastroesophageal                            junction was found at 42 cm and the upper extent of                            the gastric folds was found at 43 cm from the                            incisors.                           A 1 cm hiatal hernia was present.                           The exam of the esophagus was otherwise normal.                           The entire examined stomach was normal. Biopsies                            were taken with a cold forceps for Helicobacter                            pylori testing.                           The examined duodenum was normal. Biopsies for  histology were taken with a cold forceps for                            evaluation of celiac disease. Complications:            No immediate complications. Estimated blood loss:                            Minimal. Estimated Blood Loss:     Estimated blood loss was minimal. Impression:               - Esophagogastric landmarks identified.                           - 1 cm hiatal hernia.                           - Normal esophagus otherwise.                           - Normal stomach. Biopsied.                           - Normal examined duodenum. Biopsied.                           Overall, no significant anatomic abnormality or                            overt pathology, but will await biopsy results. Recommendation:           - Patient has a contact number available for                            emergencies. The signs and symptoms of potential                            delayed complications were discussed with the                            patient. Return to  normal activities tomorrow.                            Written discharge instructions were provided to the                            patient.                           - Resume previous diet.                           - Continue present medications.                           - Continue Zofran daily or as needed                           -  Continue omeprazole as needed                           - Continue bentyl as needed                           - Await pathology results. Viviann Spare P. Jefry Lesinski, MD 04/04/2023 2:08:09 PM This report has been signed electronically.

## 2023-04-05 LAB — COMPREHENSIVE METABOLIC PANEL
ALT: 19 IU/L (ref 0–32)
AST: 17 IU/L (ref 0–40)
Albumin: 4.5 g/dL (ref 4.0–5.0)
Alkaline Phosphatase: 70 IU/L (ref 42–106)
BUN/Creatinine Ratio: 14 (ref 9–23)
BUN: 11 mg/dL (ref 6–20)
Bilirubin Total: 0.4 mg/dL (ref 0.0–1.2)
CO2: 20 mmol/L (ref 20–29)
Calcium: 9.6 mg/dL (ref 8.7–10.2)
Chloride: 107 mmol/L — ABNORMAL HIGH (ref 96–106)
Creatinine, Ser: 0.77 mg/dL (ref 0.57–1.00)
Globulin, Total: 2.7 g/dL (ref 1.5–4.5)
Glucose: 94 mg/dL (ref 70–99)
Potassium: 4.1 mmol/L (ref 3.5–5.2)
Sodium: 143 mmol/L (ref 134–144)
Total Protein: 7.2 g/dL (ref 6.0–8.5)
eGFR: 114 mL/min/{1.73_m2} (ref >59)

## 2023-04-05 LAB — LIPID PANEL
Chol/HDL Ratio: 4.6 ratio — ABNORMAL HIGH (ref 0.0–4.4)
Cholesterol, Total: 146 mg/dL (ref 100–169)
HDL: 32 mg/dL — ABNORMAL LOW (ref >39)
LDL Chol Calc (NIH): 86 mg/dL (ref 0–109)
Triglycerides: 159 mg/dL — ABNORMAL HIGH (ref 0–89)
VLDL Cholesterol Cal: 28 mg/dL (ref 5–40)

## 2023-04-05 LAB — HEPB+HEPC+HIV PANEL
HIV Screen 4th Generation wRfx: NONREACTIVE
Hep B C IgM: NEGATIVE
Hep B Core Total Ab: NEGATIVE
Hep B E Ab: NONREACTIVE
Hep B E Ag: NEGATIVE
Hep B Surface Ab, Qual: REACTIVE
Hep C Virus Ab: NONREACTIVE
Hepatitis B Surface Ag: NEGATIVE

## 2023-04-05 LAB — CBC WITH DIFFERENTIAL/PLATELET
Basophils Absolute: 0 10*3/uL (ref 0.0–0.2)
Basos: 0 %
EOS (ABSOLUTE): 0.1 10*3/uL (ref 0.0–0.4)
Eos: 1 %
Hematocrit: 38.7 % (ref 34.0–46.6)
Hemoglobin: 12.9 g/dL (ref 11.1–15.9)
Immature Grans (Abs): 0.1 10*3/uL (ref 0.0–0.1)
Immature Granulocytes: 1 %
Lymphocytes Absolute: 2.8 10*3/uL (ref 0.7–3.1)
Lymphs: 32 %
MCH: 28 pg (ref 26.6–33.0)
MCHC: 33.3 g/dL (ref 31.5–35.7)
MCV: 84 fL (ref 79–97)
Monocytes Absolute: 0.5 10*3/uL (ref 0.1–0.9)
Monocytes: 6 %
Neutrophils Absolute: 5.3 10*3/uL (ref 1.4–7.0)
Neutrophils: 60 %
Platelets: 283 10*3/uL (ref 150–450)
RBC: 4.61 x10E6/uL (ref 3.77–5.28)
RDW: 14.1 % (ref 11.7–15.4)
WBC: 8.9 10*3/uL (ref 3.4–10.8)

## 2023-04-05 LAB — HEMOGLOBIN A1C
Est. average glucose Bld gHb Est-mCnc: 105 mg/dL
Hgb A1c MFr Bld: 5.3 % (ref 4.8–5.6)

## 2023-04-05 LAB — TSH RFX ON ABNORMAL TO FREE T4: TSH: 2.26 u[IU]/mL (ref 0.450–4.500)

## 2023-04-07 ENCOUNTER — Telehealth: Payer: Self-pay

## 2023-04-07 NOTE — Telephone Encounter (Signed)
Attempted f/u call. No answer, left VM. 

## 2023-04-15 ENCOUNTER — Encounter: Payer: Self-pay | Admitting: Family Medicine

## 2023-04-16 ENCOUNTER — Other Ambulatory Visit (HOSPITAL_COMMUNITY): Payer: Self-pay

## 2023-04-17 LAB — SURGICAL PATHOLOGY

## 2023-04-22 ENCOUNTER — Encounter: Payer: Self-pay | Admitting: Gastroenterology

## 2023-04-28 ENCOUNTER — Ambulatory Visit: Payer: 59 | Admitting: Family Medicine

## 2023-04-30 ENCOUNTER — Other Ambulatory Visit: Payer: Self-pay | Admitting: Nurse Practitioner

## 2023-04-30 ENCOUNTER — Other Ambulatory Visit: Payer: Self-pay

## 2023-04-30 ENCOUNTER — Other Ambulatory Visit (HOSPITAL_COMMUNITY): Payer: Self-pay

## 2023-04-30 MED ORDER — MEDROXYPROGESTERONE ACETATE 150 MG/ML IM SUSY
150.0000 mg | PREFILLED_SYRINGE | INTRAMUSCULAR | 1 refills | Status: DC
  Filled 2023-04-30 – 2023-08-18 (×3): qty 1, 90d supply, fill #0

## 2023-04-30 NOTE — Addendum Note (Signed)
Addended by: Saralyn Pilar on: 04/30/2023 02:05 PM   Modules accepted: Orders

## 2023-05-03 ENCOUNTER — Other Ambulatory Visit (HOSPITAL_COMMUNITY): Payer: Self-pay

## 2023-05-05 ENCOUNTER — Other Ambulatory Visit (HOSPITAL_COMMUNITY): Payer: Self-pay

## 2023-05-27 ENCOUNTER — Other Ambulatory Visit (HOSPITAL_COMMUNITY): Payer: Self-pay

## 2023-06-29 ENCOUNTER — Other Ambulatory Visit: Payer: Self-pay | Admitting: Family Medicine

## 2023-06-29 ENCOUNTER — Other Ambulatory Visit: Payer: Self-pay | Admitting: Gastroenterology

## 2023-06-29 DIAGNOSIS — E119 Type 2 diabetes mellitus without complications: Secondary | ICD-10-CM

## 2023-06-29 DIAGNOSIS — R110 Nausea: Secondary | ICD-10-CM

## 2023-07-06 ENCOUNTER — Other Ambulatory Visit: Payer: Self-pay | Admitting: Family Medicine

## 2023-07-06 ENCOUNTER — Other Ambulatory Visit: Payer: Self-pay | Admitting: Gastroenterology

## 2023-07-06 DIAGNOSIS — R110 Nausea: Secondary | ICD-10-CM

## 2023-07-06 DIAGNOSIS — E119 Type 2 diabetes mellitus without complications: Secondary | ICD-10-CM

## 2023-07-07 ENCOUNTER — Other Ambulatory Visit: Payer: Self-pay | Admitting: Family Medicine

## 2023-07-07 ENCOUNTER — Other Ambulatory Visit (HOSPITAL_COMMUNITY): Payer: Self-pay

## 2023-07-07 ENCOUNTER — Telehealth: Payer: Self-pay | Admitting: Gastroenterology

## 2023-07-07 ENCOUNTER — Other Ambulatory Visit: Payer: Self-pay

## 2023-07-07 ENCOUNTER — Telehealth: Payer: 59 | Admitting: Physician Assistant

## 2023-07-07 DIAGNOSIS — A084 Viral intestinal infection, unspecified: Secondary | ICD-10-CM | POA: Diagnosis not present

## 2023-07-07 DIAGNOSIS — E119 Type 2 diabetes mellitus without complications: Secondary | ICD-10-CM

## 2023-07-07 MED ORDER — ONDANSETRON 4 MG PO TBDP
4.0000 mg | ORAL_TABLET | Freq: Three times a day (TID) | ORAL | 0 refills | Status: DC | PRN
  Filled 2023-07-07: qty 20, 4d supply, fill #0

## 2023-07-07 NOTE — Progress Notes (Signed)
E-Visit for Nausea and Vomiting   We are sorry that you are not feeling well. Here is how we plan to help!  Based on what you have shared with me it looks like you have a Virus that is irritating your GI tract.  Vomiting is the forceful emptying of a portion of the stomach's content through the mouth.  Although nausea and vomiting can make you feel miserable, it's important to remember that these are not diseases, but rather symptoms of an underlying illness.  When we treat short term symptoms, we always caution that any symptoms that persist should be fully evaluated in a medical office.  I have prescribed a medication that will help alleviate your symptoms and allow you to stay hydrated:  Zofran 4 mg 1 tablet every 8 hours as needed for nausea and vomiting  For your symptoms of diarrhea you may take Imodium 2 mg tablets that are over the counter at your local pharmacy. Take two tablet now and then one after each loose stool up to 6 a day.    HOME CARE: Drink clear liquids.  This is very important! Dehydration (the lack of fluid) can lead to a serious complication.  Start off with 1 tablespoon every 5 minutes for 8 hours. You may begin eating bland foods after 8 hours without vomiting.  Start with saltine crackers, white bread, rice, mashed potatoes, applesauce. After 48 hours on a bland diet, you may resume a normal diet. Try to go to sleep.  Sleep often empties the stomach and relieves the need to vomit.  GET HELP RIGHT AWAY IF:  Your symptoms do not improve or worsen within 2 days after treatment. You have a fever for over 3 days. You cannot keep down fluids after trying the medication.  MAKE SURE YOU:  Understand these instructions. Will watch your condition. Will get help right away if you are not doing well or get worse.    Thank you for choosing an e-visit.  Your e-visit answers were reviewed by a board certified advanced clinical practitioner to complete your personal care  plan. Depending upon the condition, your plan could have included both over the counter or prescription medications.  Please review your pharmacy choice. Make sure the pharmacy is open so you can pick up prescription now. If there is a problem, you may contact your provider through [employer] and have the prescription routed to another pharmacy.  Your safety is important to [place]. If you have drug allergies check your prescription carefully.   For the next 24 hours you can use MyChart to ask questions about today's visit, request a non-urgent call back, or ask for a work or school excuse. You will get an email in the next two days asking about your experience. I hope that your e-visit has been valuable and will speed your recovery.  I have spent 5 minutes in review of e-visit questionnaire, review and updating patient chart, medical decision making and response to patient.   Margaretann Loveless, PA-C

## 2023-07-07 NOTE — Telephone Encounter (Signed)
Patient called and stated that she is an extreme pain to the point she is having heart palpations. Patient also stated she is throwing up. Patient also stated that she did need more refills on her Zofran and is requesting it to go to [facility]. Patient is requesting a call back. Please advise.

## 2023-07-08 ENCOUNTER — Other Ambulatory Visit: Payer: Self-pay

## 2023-07-08 ENCOUNTER — Other Ambulatory Visit (HOSPITAL_COMMUNITY): Payer: Self-pay

## 2023-07-08 NOTE — Telephone Encounter (Signed)
Patient with several year history of intermittent nausea, vomiting, diarrhea and abdominal pain  Patient states that she went for a 3 month period of feeling well with no diarrhea, vomiting or abdominal pain. Did not need to take dicyclomine or zofran at all. However, over the last month, she has began having symptoms again. Yesterday morning, she began having severe generalized and epigastric abdominal pain, characterized as cramping and radiating into the back. She states that the pain became so severe that she took 3 bentyl at the same time as well as 2 zofran tablets. Contemplated going to the emergency room, but pain ended up easing off. Patient also states that she had 3 diarrhea bowel movements yesterday but denies any hematochezia or melena. Denies any fever. Did have 1 episode of vomiting yesterday as well. Says today, she is feeling quite a bit better.  Patient has not been taking omeprazole 20 mg daily as prescribed up until the last couple of days when "things got really bad). Therefore, I have asked that she restart omeprazole 20 mg and take consistently every day, about 30 minutes before breakfast meal. I have also advised that she continue zofran 1 tablet 1-2 times daily and bentyl twice daily. BRAT diet for the next couple of days.  Dr Adela Lank, please advise.Marland KitchenMarland Kitchen

## 2023-07-08 NOTE — Telephone Encounter (Signed)
Patient is advised of Dr Venida Jarvis recommendations. She does not feel that the ozempic relates to her symptoms as she was having symptoms prior to beginning ozempic and then went 3 months with no symptoms at all.   Patient has been scheduled for follow up with Dr Adela Lank on 09/25/23. She will call back for a sooner appointment with an APP should her symptoms continue to worsen.

## 2023-07-08 NOTE — Telephone Encounter (Signed)
Agree with your recommendations of PPI and Zofran.  We had previously discussed if her symptoms did not improve she may want to hold or stop Ozempic. Not sure how she feels about that but if symptoms persist that may be reasonable.  I'd resume her meds as you recommend and can you book her for a routine office visit with me for reassessment. If she is feeling worse in the interim she should let us know. Thanks

## 2023-07-10 ENCOUNTER — Other Ambulatory Visit (HOSPITAL_COMMUNITY): Payer: Self-pay

## 2023-07-10 ENCOUNTER — Other Ambulatory Visit: Payer: Self-pay

## 2023-07-10 ENCOUNTER — Encounter: Payer: Self-pay | Admitting: Family Medicine

## 2023-07-10 ENCOUNTER — Telehealth: Payer: Self-pay

## 2023-07-10 DIAGNOSIS — E119 Type 2 diabetes mellitus without complications: Secondary | ICD-10-CM

## 2023-07-10 MED ORDER — OZEMPIC (2 MG/DOSE) 8 MG/3ML ~~LOC~~ SOPN
2.0000 mg | PEN_INJECTOR | SUBCUTANEOUS | 0 refills | Status: DC
  Filled 2023-07-10: qty 3, 28d supply, fill #0

## 2023-07-10 NOTE — Telephone Encounter (Signed)
Copied from CRM [id number]. Topic: Clinical - Medical Advice >> Jul 10, 2023  1:50 PM Fonda Kinder J wrote: Reason for CRM: Pt has been without Ozempic for a week, her last refill request was denied due to her PCP wanting her to schedule a visit. I have the pt scheduled for 08/18/2023 but the pt is worried she would be without the medication for a month before she is able to get a refill. Pt wants to know are there any recommendations or anything she can due in the meantime

## 2023-07-10 NOTE — Telephone Encounter (Signed)
Refill sent to [facility]

## 2023-07-11 ENCOUNTER — Ambulatory Visit: Payer: Self-pay | Admitting: Family Medicine

## 2023-07-11 NOTE — Telephone Encounter (Signed)
Copied from CRM [id number]. Topic: Clinical - Red Word Triage >> Jul 11, 2023  9:47 AM Hector Shade B wrote: [facility] that prompted transfer to Nurse Triage: Severe abdominal pain, worsening from the past 4 days, Kayla Kelly is mom, she said throwing up   Chief Complaint: Abdominal pain Symptoms: Pain, Nausea/Vomiting Up Frequency: for the past 3 hours but pain eased up, one episode last week, and this has been going on for many years Pertinent Negatives: Patient denies Chest pain, difficulty breathing,  Disposition: [] ED /[x] Urgent Care (no appt availability in office) / [] Appointment(In office/virtual)/ []  [facility]/ [] Home Care/ [x] Refused Recommended Disposition /[] [facility]/ []  Follow-up with PCP Additional Notes: Patient's mother called for the patient because she was in so much pain and patient's pain resolved while this RN was on the phone with her mother so patient was able to answer questions as well.  Patient states she had an episode last week of upper abdominal pain and went away and now she had a 3 hours episode this morning.  Patient states that the pain radiates to her back when it is happening.  She denies any fevers or urinary issues and denies any chance of pregnancy.  Patient has been dealing with this for a long time and had lab work, an endoscopy, and an ultrasound in the past which all were negative.  [facility] cannot see patient until March and patient already has prescriptions for dicyclomine and zofran which sometimes help.  Patient will be going back to school January 10th and they would like for her to be seen before she goes back.  During this triage encounter, patient's pain went away for the time but they still want to find out what is causing these issues.  Patient's mother is concerned for possible issues with liver, gallbladder, or pancreas.  Per disposition, patient is recommended to go to an urgent care since there are no appointments available.  They refused  the urgent care disposition and would like her PCP to get her worked into the office prior to January 10th.  They are advised that if anything gets worse, for the patient to be taken to the emergency room.  They verbalized understanding of these instructions.  Reason for Disposition  [1] MILD-MODERATE pain AND [2] not relieved by antacid medicine  Answer Assessment - Initial Assessment Questions 1. LOCATION: "Where does it hurt?"      Upper abdomen all the way across  2. RADIATION: "Does the pain shoot anywhere else?" (e.g., chest, back)     Radiates to the back 3. ONSET: "When did the pain begin?" (e.g., minutes, hours or days ago)      One episode last week, went away, and now an episode this morning x 3 hours but starting to feel a little better 4. SUDDEN: "Gradual or sudden onset?"     "Woke up in pain--after throwing up I usually feel better" 5. PATTERN "Does the pain come and go, or is it constant?"    - If it comes and goes: "How long does it last?" "Do you have pain now?"     (Note: Comes and goes means the pain is intermittent. It goes away completely between bouts.)    - If constant: "Is it getting better, staying the same, or getting worse?"      (Note: Constant means the pain never goes away completely; most serious pain is constant and gets worse.)      Depending on episode type 6. SEVERITY: "  How bad is the pain?"  (e.g., Scale 1-10; mild, moderate, or severe)    - MILD (1-3): Doesn't interfere with normal activities, abdomen soft and not tender to touch..     - MODERATE (4-7): Interferes with normal activities or awakens from sleep, abdomen tender to touch.     - SEVERE (8-10): Excruciating pain, doubled over, unable to do any normal activities.       0 pain now but episodes are 8-10  7. RECURRENT SYMPTOM: "Have you ever had this type of stomach pain before?" If Yes, ask: "When was the last time?" and "What happened that time?"      Off and on episodes for a long time 8.  AGGRAVATING FACTORS: "Does anything seem to cause this pain?" (e.g., foods, stress, alcohol)     Unknown 9. CARDIAC SYMPTOMS: "Do you have any of the following symptoms: chest pain, difficulty breathing, sweating, nausea?"     Sweating during vomiting but denies chest pain or difficulty breathing 10. OTHER SYMPTOMS: "Do you have any other symptoms?" (e.g., back pain, diarrhea, fever, urination pain, vomiting)       During episodes diarrhea vomiting and back pain but never any fevers or urinary issues 11. PREGNANCY: "Is there any chance you are pregnant?" "When was your last menstrual period?"       No--on Depo birth control  Answer Assessment - Initial Assessment Questions 1. LOCATION: "Where does it hurt?"      Upper abdominal all the way across 2. RADIATION: "Does the pain shoot anywhere else?" (e.g., chest, back)     Radiates to the back 3. ONSET: "When did the pain begin?" (e.g., minutes, hours or days ago)      One episode last week, went away, and now an episode this morning x 3 hours but starting to feel a little better 4. SUDDEN: "Gradual or sudden onset?"     "Woke up in pain--after throwing up I usually feel better.  Had a bowel movement this morning and felt a little better" 5. PATTERN "Does the pain come and go, or is it constant?"    - If it comes and goes: "How long does it last?" "Do you have pain now?"     (Note: Comes and goes means the pain is intermittent. It goes away completely between bouts.)    - If constant: "Is it getting better, staying the same, or getting worse?"      (Note: Constant means the pain never goes away completely; most serious pain is constant and gets worse.)      Depending on the episode type  comes and goes 6. SEVERITY: "How bad is the pain?"  (e.g., Scale 1-10; mild, moderate, or severe)    - MILD (1-3): Doesn't interfere with normal activities, abdomen soft and not tender to touch.     - MODERATE (4-7): Interferes with normal activities or  awakens from sleep, abdomen tender to touch.     - SEVERE (8-10): Excruciating pain, doubled over, unable to do any normal activities.       Right now no pain but this is something she has been dealing with for a long time 7. RECURRENT SYMPTOM: "Have you ever had this type of stomach pain before?" If Yes, ask: "When was the last time?" and "What happened that time?"      Yes last week one episode and many years 8. CAUSE: "What do you think is causing the stomach pain?"     Unknown 9. RELIEVING/AGGRAVATING  FACTORS: "What makes it better or worse?" (e.g., antacids, bending or twisting motion, bowel movement)     Vomiting can help sometimes and laying on left side feels better 10. OTHER SYMPTOMS: "Do you have any other symptoms?" (e.g., back pain, diarrhea, fever, urination pain, vomiting)       Episodes of back pain, diarrhea, vomiting.  Denies any fever or urinary issues 11. PREGNANCY: "Is there any chance you are pregnant?" "When was your last menstrual period?"       No--on Depo birth control  Protocols used: Abdominal Pain - Female-A-AH, Abdominal Pain - Upper-A-AH

## 2023-07-14 ENCOUNTER — Other Ambulatory Visit: Payer: Self-pay

## 2023-07-17 NOTE — Telephone Encounter (Signed)
Copied from CRM [id number]. Topic: Clinical - Lab/Test Results >> Jul 17, 2023  2:47 PM Alvino Blood C wrote: Reason for CRM: PT is experiencing stomach pain and wants to know if the doctor would recommend any testing or labs be completed prior to her appointment on the 27th. Pt is asking that someone give her a call back to also discuss her medication: ondansetron (ZOFRAN-ODT) 4 MG disintegrating tablet

## 2023-07-18 ENCOUNTER — Other Ambulatory Visit (HOSPITAL_COMMUNITY): Payer: Self-pay

## 2023-07-18 ENCOUNTER — Other Ambulatory Visit: Payer: Self-pay | Admitting: Family Medicine

## 2023-07-18 ENCOUNTER — Other Ambulatory Visit: Payer: Self-pay

## 2023-07-18 DIAGNOSIS — F411 Generalized anxiety disorder: Secondary | ICD-10-CM

## 2023-07-18 DIAGNOSIS — E119 Type 2 diabetes mellitus without complications: Secondary | ICD-10-CM

## 2023-07-18 MED ORDER — OZEMPIC (2 MG/DOSE) 8 MG/3ML ~~LOC~~ SOPN
2.0000 mg | PEN_INJECTOR | SUBCUTANEOUS | 3 refills | Status: DC
  Filled 2023-07-18 – 2023-08-04 (×2): qty 3, 28d supply, fill #0

## 2023-07-18 MED ORDER — BUPROPION HCL ER (XL) 300 MG PO TB24
300.0000 mg | ORAL_TABLET | Freq: Every day | ORAL | 1 refills | Status: DC
  Filled 2023-07-18: qty 90, 90d supply, fill #0
  Filled 2023-11-09: qty 90, 90d supply, fill #1

## 2023-07-18 NOTE — Telephone Encounter (Signed)
Called pt she is advised of her Rx that was sent to the pharmacy and the recommendation about that HIDA

## 2023-07-18 NOTE — Telephone Encounter (Signed)
Called pt she state that she needs refills on her Ozempic and her Wellbutrin she also stated that she would like a HIDA scan done

## 2023-07-18 NOTE — Telephone Encounter (Signed)
Can you ask the patient what she would like to discuss regarding the medication?  I don't know if I can call her while I am seeing patients.    I reviewed her chart and the only lab test I could think of that hasn't been done already is a GI pathogen panel, which is a stool sample, to look for infectious causes of her symptoms.

## 2023-07-18 NOTE — Telephone Encounter (Signed)
I will send in the refills for ozempic and Wellbutrin.  She would need to discuss if the HIDA scan is appropriate with the gastroenterologist.  It is not something a primary care provider would typically order, especially in the absence of abnormal liver function tests.

## 2023-08-04 ENCOUNTER — Other Ambulatory Visit (HOSPITAL_COMMUNITY): Payer: Self-pay

## 2023-08-04 ENCOUNTER — Other Ambulatory Visit: Payer: Self-pay

## 2023-08-18 ENCOUNTER — Telehealth (INDEPENDENT_AMBULATORY_CARE_PROVIDER_SITE_OTHER): Payer: Commercial Managed Care - PPO | Admitting: Family Medicine

## 2023-08-18 ENCOUNTER — Other Ambulatory Visit (HOSPITAL_COMMUNITY): Payer: Self-pay

## 2023-08-18 ENCOUNTER — Other Ambulatory Visit: Payer: Self-pay

## 2023-08-18 ENCOUNTER — Ambulatory Visit: Payer: Commercial Managed Care - PPO | Admitting: Family Medicine

## 2023-08-18 ENCOUNTER — Encounter: Payer: Self-pay | Admitting: Family Medicine

## 2023-08-18 DIAGNOSIS — R112 Nausea with vomiting, unspecified: Secondary | ICD-10-CM | POA: Diagnosis not present

## 2023-08-18 DIAGNOSIS — F332 Major depressive disorder, recurrent severe without psychotic features: Secondary | ICD-10-CM | POA: Diagnosis not present

## 2023-08-18 DIAGNOSIS — Z7985 Long-term (current) use of injectable non-insulin antidiabetic drugs: Secondary | ICD-10-CM | POA: Diagnosis not present

## 2023-08-18 DIAGNOSIS — E119 Type 2 diabetes mellitus without complications: Secondary | ICD-10-CM

## 2023-08-18 MED ORDER — QUETIAPINE FUMARATE 50 MG PO TABS
50.0000 mg | ORAL_TABLET | Freq: Every day | ORAL | 2 refills | Status: DC
  Filled 2023-08-18: qty 30, 30d supply, fill #0

## 2023-08-18 MED ORDER — ONDANSETRON 4 MG PO TBDP
4.0000 mg | ORAL_TABLET | Freq: Three times a day (TID) | ORAL | 4 refills | Status: DC | PRN
  Filled 2023-08-18: qty 20, 4d supply, fill #0

## 2023-08-18 MED ORDER — OZEMPIC (2 MG/DOSE) 8 MG/3ML ~~LOC~~ SOPN
2.0000 mg | PEN_INJECTOR | SUBCUTANEOUS | 3 refills | Status: DC
  Filled 2023-08-18 – 2023-09-09 (×2): qty 3, 28d supply, fill #0
  Filled 2023-10-07 – 2023-10-21 (×3): qty 3, 28d supply, fill #1
  Filled 2023-11-09 – 2023-11-13 (×2): qty 3, 28d supply, fill #2
  Filled 2023-12-11: qty 3, 28d supply, fill #3

## 2023-08-18 MED ORDER — FLUOXETINE HCL 20 MG PO CAPS
60.0000 mg | ORAL_CAPSULE | Freq: Every day | ORAL | 1 refills | Status: AC
  Filled 2023-08-18 – 2023-11-09 (×2): qty 270, 90d supply, fill #0
  Filled 2024-02-05 (×3): qty 270, 90d supply, fill #1

## 2023-08-18 NOTE — Progress Notes (Signed)
Virtual Visit via Video Note  I connected with Kayla Kelly on 08/18/23 at  1:30 PM EST by a video enabled telemedicine application and verified that I am speaking with the correct person using two identifiers.  Patient Location: Home Provider Location: Office/clinic  I discussed the limitations, risks, security, and privacy concerns of performing an evaluation and management service by video and the availability of in person appointments. I also discussed with the patient that there may be a patient responsible charge related to this service. The patient expressed understanding and agreed to proceed.    Subjective   Patient ID: Kayla Kelly, female    DOB: 04/25/2001  Age: 23 y.o. MRN: [id number]  Chief Complaint  Patient presents with   Follow-up    Issues with medication with getting ozempic refilled, needs a refill on zofran, mental health is declining     HPI Kayla Kelly is a 23 y.o. female presenting today for follow up of diabetes, mood. Diabetes: denies hypoglycemic events, wounds or sores that are not healing well, increased thirst or urination. Denies vision problems, eye exam due.  Tolerating Ozempic 2 mg weekly as prescribed without any side effects.  She is glad that she has been tolerating it well, but she is a bit frustrated that she has not noticed any weight loss. Mood: Patient is here to follow up for insomnia, depression, anxiety, currently managing with Prozac 60 mg daily, bupropion 300 mg daily, and trazodone 50 mg as needed.  She has noticed over the past couple of weeks that her mental health has been declining.  She is not sure if this is secondary to being at the end of her Depo injection cycle, she is due for her next Depo shot within the next 2 weeks.  She is still experiencing significant nightmares and night sweats as well.  She notes that in the future, she is going to apply for an in-home service dog which she believes will be very helpful.  In the meantime, she  would like to discuss potentially adjusting medication management.     08/18/2023    1:40 PM 01/20/2023    2:15 PM 10/17/2022   10:23 AM  Depression screen PHQ 2/9  Decreased Interest 3 2 1   Down, Depressed, Hopeless 2 1 1   PHQ - 2 Score 5 3 2   Altered sleeping 2 3 3   Tired, decreased energy 2 3 3   Change in appetite 2 1 2   Feeling bad or failure about yourself  2 1 1   Trouble concentrating 1 2 2   Moving slowly or fidgety/restless 1 1 1   Suicidal thoughts 0 0 1  PHQ-9 Score 15 14 15   Difficult doing work/chores Very difficult Somewhat difficult        08/18/2023    1:42 PM 01/20/2023    2:16 PM 10/17/2022   10:24 AM 01/16/2022   11:03 AM  GAD 7 : Generalized Anxiety Score  Nervous, Anxious, on Edge 3 2 2 1   Control/stop worrying 3 1 3 2   Worry too much - different things 3 3 3 1   Trouble relaxing 2 1 2 1   Restless 1 2 1  0  Easily annoyed or irritable 2 3 2 1   Afraid - awful might happen 3 1 1  0  Total GAD 7 Score 17 13 14 6   Anxiety Difficulty Very difficult Somewhat difficult       ROS Negative unless otherwise noted in HPI  Outpatient Medications Prior to Visit  Medication Sig  blood glucose meter kit and supplies KIT Use to test blood sugar daily   buPROPion (WELLBUTRIN XL) 300 MG 24 hr tablet Take 1 tablet (300 mg total) by mouth daily.   dicyclomine (BENTYL) 20 MG tablet Take 1 tablet (20 mg total) by mouth 3 (three) times daily as needed for spasms.   glucose blood test strip Use to test blood sugar daily   Lancets (FREESTYLE) lancets Use to test blood sugar daily   medroxyPROGESTERone Acetate 150 MG/ML SUSY Inject 1 mL (150 mg total) into the muscle every 3 (three) months.   traZODone (DESYREL) 50 MG tablet Take 0.5-1 tablets (25-50 mg total) by mouth at bedtime as needed for sleep.   [DISCONTINUED] FLUoxetine (PROZAC) 20 MG capsule Take 3 capsules (60 mg) by mouth daily.   [DISCONTINUED] fluticasone (FLONASE) 50 MCG/ACT nasal spray Place 2 sprays into both nostrils  daily. (Patient taking differently: Place 2 sprays into both nostrils as needed.)   [DISCONTINUED] ondansetron (ZOFRAN-ODT) 4 MG disintegrating tablet Take 1-2 tablets (4-8 mg total) by mouth every 8 (eight) hours as needed.   [DISCONTINUED] propranolol (INDERAL) 20 MG tablet Take 1 tablet (20 mg total) by mouth 3 (three) times daily.   [DISCONTINUED] Semaglutide, 2 MG/DOSE, (OZEMPIC, 2 MG/DOSE,) 8 MG/3ML SOPN Inject 2 mg as directed once a week.   Facility-Administered Medications Prior to Visit  Medication Dose Route Frequency Provider   0.9 %  sodium chloride infusion  500 mL Intravenous Once Armbruster, Willaim Rayas, MD     Objective:     Physical Exam General: Speaking clearly in complete sentences without any shortness of breath.  Alert and oriented x3.  Normal judgment. No apparent acute distress.   Assessment & Plan:  Type 2 diabetes mellitus without complication, without long-term current use of insulin (HCC) Assessment & Plan: Well-controlled, Ozempic has been well-tolerated.  Continue Ozempic 2 mg weekly.  Will continue to monitor.  Orders: -     Ozempic (2 MG/DOSE); Inject 2 mg as directed once a week.  Dispense: 3 mL; Refill: 3  MDD (major depressive disorder), recurrent severe, without psychosis (HCC) Assessment & Plan: PHQ-9 score 15, GAD-7 score 17.  Both are above baseline.  Continue Prozac 60 mg daily (max dose), bupropion 300 mg daily.  Add quetiapine 50 mg daily at bedtime as augment for treatment resistant depression as well as for insomnia.  We discussed that if after a few weeks of quetiapine she is still experiencing significant nightmares and night sweats, a trial of prazosin may be beneficial.  Labs have been within normal limits when checked, so suspect that night sweats are secondary to nightmares.  Follow-up in 4-6 weeks and adjust management as needed.  We may also consider a referral to psychiatry if quetiapine is ineffective.  Orders: -     FLUoxetine HCl;  Take 3 capsules (60 mg) by mouth daily.  Dispense: 270 capsule; Refill: 1 -     QUEtiapine Fumarate; Take 1 tablet (50 mg total) by mouth at bedtime.  Dispense: 30 tablet; Refill: 2  Nausea and vomiting, unspecified vomiting type -     Ondansetron; Dissolve 1-2 tablets (4-8 mg total) by mouth every 8 (eight) hours as needed.  Dispense: 20 tablet; Refill: 4    Return in about 4 weeks (around 09/15/2023) for follow-up for sleep, mood, in person or video.   I discussed the assessment and treatment plan with the patient. The patient was provided an opportunity to ask questions, and all were answered. The  patient agreed with the plan and demonstrated an understanding of the instructions.   The patient was advised to call back or seek an in-person evaluation if the symptoms worsen or if the condition fails to improve as anticipated.  The above assessment and management plan was discussed with the patient. The patient verbalized understanding of and has agreed to the management plan.   Melida Quitter, PA

## 2023-08-18 NOTE — Assessment & Plan Note (Signed)
PHQ-9 score 15, GAD-7 score 17.  Both are above baseline.  Continue Prozac 60 mg daily (max dose), bupropion 300 mg daily.  Add quetiapine 50 mg daily at bedtime as augment for treatment resistant depression as well as for insomnia.  We discussed that if after a few weeks of quetiapine she is still experiencing significant nightmares and night sweats, a trial of prazosin may be beneficial.  Labs have been within normal limits when checked, so suspect that night sweats are secondary to nightmares.  Follow-up in 4-6 weeks and adjust management as needed.  We may also consider a referral to psychiatry if quetiapine is ineffective.

## 2023-08-18 NOTE — Assessment & Plan Note (Signed)
Well-controlled, Ozempic has been well-tolerated.  Continue Ozempic 2 mg weekly.  Will continue to monitor.

## 2023-08-19 ENCOUNTER — Other Ambulatory Visit (HOSPITAL_COMMUNITY): Payer: Self-pay

## 2023-08-19 ENCOUNTER — Other Ambulatory Visit (HOSPITAL_BASED_OUTPATIENT_CLINIC_OR_DEPARTMENT_OTHER): Payer: Self-pay

## 2023-08-28 ENCOUNTER — Encounter: Payer: Self-pay | Admitting: Family Medicine

## 2023-09-09 ENCOUNTER — Other Ambulatory Visit (HOSPITAL_COMMUNITY): Payer: Self-pay

## 2023-09-09 ENCOUNTER — Other Ambulatory Visit: Payer: Self-pay

## 2023-09-22 ENCOUNTER — Encounter: Payer: Self-pay | Admitting: Family Medicine

## 2023-09-25 ENCOUNTER — Other Ambulatory Visit: Payer: Self-pay

## 2023-09-25 ENCOUNTER — Ambulatory Visit (INDEPENDENT_AMBULATORY_CARE_PROVIDER_SITE_OTHER): Payer: 59 | Admitting: Gastroenterology

## 2023-09-25 ENCOUNTER — Encounter: Payer: Self-pay | Admitting: Gastroenterology

## 2023-09-25 VITALS — BP 134/72 | HR 104 | Ht 67.0 in | Wt 312.0 lb

## 2023-09-25 DIAGNOSIS — R194 Change in bowel habit: Secondary | ICD-10-CM | POA: Diagnosis not present

## 2023-09-25 DIAGNOSIS — R112 Nausea with vomiting, unspecified: Secondary | ICD-10-CM

## 2023-09-25 DIAGNOSIS — R161 Splenomegaly, not elsewhere classified: Secondary | ICD-10-CM

## 2023-09-25 DIAGNOSIS — R1010 Upper abdominal pain, unspecified: Secondary | ICD-10-CM

## 2023-09-25 DIAGNOSIS — K580 Irritable bowel syndrome with diarrhea: Secondary | ICD-10-CM

## 2023-09-25 MED ORDER — CITRUCEL PO POWD: 1.0000 | Freq: Every day | ORAL | Status: AC

## 2023-09-25 MED ORDER — DICYCLOMINE HCL 20 MG PO TABS
20.0000 mg | ORAL_TABLET | Freq: Three times a day (TID) | ORAL | 2 refills | Status: AC | PRN
  Filled 2023-09-25: qty 60, 20d supply, fill #0
  Filled 2023-11-09: qty 60, 20d supply, fill #1
  Filled 2024-02-05 (×3): qty 60, 20d supply, fill #2

## 2023-09-25 MED ORDER — OMEPRAZOLE 20 MG PO CPDR
20.0000 mg | DELAYED_RELEASE_CAPSULE | Freq: Every day | ORAL | 3 refills | Status: DC
  Filled 2023-09-25: qty 90, 90d supply, fill #0

## 2023-09-25 MED ORDER — ONDANSETRON 4 MG PO TBDP
4.0000 mg | ORAL_TABLET | Freq: Three times a day (TID) | ORAL | 3 refills | Status: AC | PRN
  Filled 2023-09-25: qty 90, 15d supply, fill #0
  Filled 2023-11-09: qty 90, 15d supply, fill #1
  Filled 2023-12-11: qty 90, 15d supply, fill #2
  Filled 2024-02-05 (×3): qty 90, 15d supply, fill #3

## 2023-09-25 NOTE — Progress Notes (Signed)
 HPI :  23 y/o female here for a follow up visit. I last saw her in August 2024.  See prior clinic visit note for full details of her case.  Recall she has had problems with altered bowel habits, abdominal pain, intermittent nausea and vomiting.  Recall her workup to date has included labs, tested negative for celiac disease, H. pylori.  Fecal calprotectin normal.  Tested negative for alpha gal.  She has had a prior abdominal ultrasound showing a normal gallbladder but mild splenomegaly.  I performed an upper endoscopy for her last September which was normal, biopsies of her stomach and small intestine were normal.  I had asked her to take omeprazole once daily every day for 30 days for good PPI trial to treat possible dyspepsia.  She has historically stated Zofran and dicyclomine worked very well to control her symptoms however she does not like taking them daily given she has high pill burden from her other medical conditions, she has not been taking them routinely.  Recall she also takes Ozempic but states her symptoms had started well before she started the Ozempic and thinks she has tolerated it.  Generally she has done pretty well since have last seen her.  She states she had several months where she was feeling well without any issues and was not taking any medications.  Over December when she was home from winter break from school (she is currently a Consulting civil engineer at [employer]), she was eating heavier/fattier foods and had a few attacks of severe upper abdominal pain that was associated with vomiting.  Pain was localized to her upper abdomen, she thinks perhaps radiated to her back, it lasted several hours before abating.  She has not been taking any NSAIDs.  No blood in her stools.  Since December she has not had many attacks like that.  Recall her prior abdominal pain has been mostly all over her entire abdomen and responds reliably well to dicyclomine.  This pain in December was different.  She  otherwise has been having altered stool form, ranges from loose stools to constipated stools and normal.  In recent months she is felt a bit better.  She states from mental health perspective her dog passed away and she has had struggles dealing with that, followed by behavioral health.    US abdomen 02/06/23: IMPRESSION: 1. No sonographic etiology for abdominal pain is identified. 2. Mild splenomegaly.     EGD 04/04/23: - Esophagogastric landmarks were identified: the Z-line was found at 42 cm, the gastroesophageal junction was found at 42 cm and the upper extent of the gastric folds was found at 43 cm from the incisors. Findings: - A 1 cm hiatal hernia was present. - The exam of the esophagus was otherwise normal. - The entire examined stomach was normal. Biopsies were taken with a cold forceps for Helicobacter pylori testing. - The examined duodenum was normal. Biopsies for histology were taken with a cold forceps for evaluation of celiac disease.  FINAL DIAGNOSIS        1. Surgical [P], duodenal :       - DUODENAL MUCOSA WITH MILD REACTIVE CHANGES       - NEGATIVE FOR CELIAC CHANGE        2. Surgical [P], gastric :       - GASTRIC, OXYNTIC AND TRANSITIONAL TYPE MUCOSA WITH FOCAL REACTIVE CHANGES AND       MILD CHRONIC, NONSPECIFIC INFLAMMATION       -  IMMUNOHISTOCHEMICAL STAIN FOR HELICOBACTER ORGANISMS IS NEGATIVE    Fecal calprotectin 03/07/23 - 32   Past Medical History:  Diagnosis Date   Anxiety    Blood clots in brain 2022   Depression 2018   IBS (irritable bowel syndrome) 2023   Obesity    Pneumonia    Pre-diabetes 2021     History reviewed. No pertinent surgical history. Family History  Problem Relation Age of Onset   Heart attack Mother    High Cholesterol Mother    High blood pressure Mother    Clotting disorder Mother    High Cholesterol Father    High blood pressure Father    Irritable bowel syndrome Father    Cancer - Cervical Maternal Grandmother     Cancer - Colon Maternal Grandmother    Social History   Tobacco Use   Smoking status: Never    Passive exposure: Current   Smokeless tobacco: Never  Vaping Use   Vaping status: Never Used  Substance Use Topics   Alcohol use: Never   Drug use: Yes    Types: Marijuana    Comment: over 1 month   Current Outpatient Medications  Medication Sig Dispense Refill   blood glucose meter kit and supplies KIT Use to test blood sugar daily 1 each 0   buPROPion (WELLBUTRIN XL) 300 MG 24 hr tablet Take 1 tablet (300 mg total) by mouth daily. 90 tablet 1   dicyclomine (BENTYL) 20 MG tablet Take 1 tablet (20 mg total) by mouth 3 (three) times daily as needed for spasms. 90 tablet 2   FLUoxetine (PROZAC) 20 MG capsule Take 3 capsules (60 mg) by mouth daily. 270 capsule 1   glucose blood test strip Use to test blood sugar daily 100 each 0   Lancets (FREESTYLE) lancets Use to test blood sugar daily 100 each 0   medroxyPROGESTERone Acetate 150 MG/ML SUSY Inject 1 mL (150 mg total) into the muscle every 3 (three) months. 1 mL 1   ondansetron (ZOFRAN-ODT) 4 MG disintegrating tablet Dissolve 1-2 tablets (4-8 mg total) by mouth every 8 (eight) hours as needed. 20 tablet 4   Semaglutide, 2 MG/DOSE, (OZEMPIC, 2 MG/DOSE,) 8 MG/3ML SOPN Inject 2 mg as directed once a week. 3 mL 3   traZODone (DESYREL) 50 MG tablet Take 0.5-1 tablets (25-50 mg total) by mouth at bedtime as needed for sleep. (Patient not taking: Reported on 09/25/2023) 30 tablet 1   Current Facility-Administered Medications  Medication Dose Route Frequency Provider Last Rate Last Admin   0.9 %  sodium chloride infusion  500 mL Intravenous Once Cheetara Hoge, Willaim Rayas, MD       Allergies  Allergen Reactions   Benzalkonium Chloride Anaphylaxis   Neomycin-Bacitracin Zn-Polymyx Anaphylaxis   Neosporin [Bacitracin-Polymyxin B]      Review of Systems: All systems reviewed and negative except where noted in HPI.   Lab Results  Component Value Date    WBC 8.9 04/04/2023   HGB 12.9 04/04/2023   HCT 38.7 04/04/2023   MCV 84 04/04/2023   PLT 283 04/04/2023    Lab Results  Component Value Date   NA 143 04/04/2023   CL 107 (H) 04/04/2023   K 4.1 04/04/2023   CO2 20 04/04/2023   BUN 11 04/04/2023   CREATININE 0.77 04/04/2023   EGFR 114 04/04/2023   CALCIUM 9.6 04/04/2023   ALBUMIN 4.5 04/04/2023   GLUCOSE 94 04/04/2023    Lab Results  Component Value Date   ALT  19 04/04/2023   AST 17 04/04/2023   ALKPHOS 70 04/04/2023   BILITOT 0.4 04/04/2023     Physical Exam: BP 134/72   Pulse (!) 104   Ht 5\' 7"  (1.702 m)   Wt (!) 312 lb (141.5 kg)   BMI 48.87 kg/m  Constitutional: Pleasant,well-developed, female in no acute distress. Abdominal: Soft, nondistended, nontender. Protuberant. There are no masses palpable.  Neurological: Alert and oriented to person place and time. Skin: Skin is warm and dry. No rashes noted. Psychiatric: Normal mood and affect. Behavior is normal.   ASSESSMENT: 23 y.o. female here for assessment of the following  1. Pain of upper abdomen   2. Nausea and vomiting, unspecified vomiting type   3. Altered bowel habits   4. Splenomegaly    History as above, chronic intermittent nausea with diffuse abdominal discomfort and altered bowel habits.  At baseline suspect she has a functional bowel disorder, however having episodic upper abdominal pain with nausea and vomiting as above, associated with eating heavier meals.  We discussed differential diagnosis.  Biliary colic remains possible although she had an ultrasound negative for gallstones.  We discussed options, given chronicity of her abdominal symptoms, offered her CT scan of the abdomen pelvis to further evaluate, this will also reassess size of her spleen which was mildly enlarged on her last ultrasound.  She wanted to proceed with this.  If it is negative and her upper abdominal complaints specifically recur, may need to consider HIDA scan as next  step.  In the interim she still has not had a good trial of PPI on a daily basis, recommend 20 mg of omeprazole daily for 4 to 6 weeks.  If this helps prevent episodes of makes her feel better, treat component of dyspepsia, will continue that.  If she finds it makes no difference then would stop it.  She does reliably respond well to Zofran and dicyclomine, we had discussed taking them daily and she does not want to do that if at baseline she is otherwise feeling okay.  We refilled those so she can use them as needed.  For her stool form, recommend trial of fiber supplementation, specifically would use Citrucel to minimize risk of gas and bloating.  She is agreeable to take this once daily to see if this can provide some regularity to her bowel habits   PLAN: - schedule CT scan abdomen / pelvis - if CT negative, consideration for HIDA scan - take omeprazole 20mg  / day for 4-6 weeks trial - if it helps continue it, if not can stop it - refill Zofran - use PRN - refilll dicyclomine - use PRN - start Citrucel once daily  Harlin Rain, MD [facility]

## 2023-09-25 NOTE — Patient Instructions (Addendum)
 You have been scheduled for a CT scan of the abdomen and pelvis at [facility], 1st floor Radiology. You are scheduled on Thursday, 3-13 at 3:30pm. You should arrive 30 minutes prior to your appointment time for registration.    Plan on being there 30 to 60 minutes, depending on the type of exam you are having performed.   If you have any questions regarding your exam or if you need to reschedule, you may call [facility] Scheduling at [phone] between the hours of 8:00 am and 5:00 pm, Monday-Friday.  __________________________________________________  We have sent the following medications to your pharmacy for you to pick up at your convenience: Omeprazole 20 mg: take once daily for 4 to 6 weeks.  If this works for you please continue it. Zofran Bentyl  Please purchase the following medications over the counter and take as directed: Citrucel - Take once daily  Thank you for entrusting me with your care and for choosing [facility], Dr. Ileene Patrick    If your blood pressure at your visit was 140/90 or greater, please contact your primary care physician to follow up on this. ______________________________________________________  If you are age 60 or older, your body mass index should be between 23-30. Your Body mass index is 48.87 kg/m. If this is out of the aforementioned range listed, please consider follow up with your Primary Care Provider.  If you are age 17 or younger, your body mass index should be between 19-25. Your Body mass index is 48.87 kg/m. If this is out of the aformentioned range listed, please consider follow up with your Primary Care Provider.  ________________________________________________________  The [facility] providers would like to encourage you to use [facility] to communicate with providers for non-urgent requests or questions.  Due to long hold times on the telephone, sending your provider a message by [facility] may be a  faster and more efficient way to get a response.  Please allow 48 business hours for a response.  Please remember that this is for non-urgent requests.  _______________________________________________________  Due to recent changes in healthcare laws, you may see the results of your imaging and laboratory studies on MyChart before your provider has had a chance to review them.  We understand that in some cases there may be results that are confusing or concerning to you. Not all laboratory results come back in the same time frame and the provider may be waiting for multiple results in order to interpret others.  Please give [place] 48 hours in order for your provider to thoroughly review all the results before contacting the office for clarification of your results.

## 2023-10-02 ENCOUNTER — Ambulatory Visit (HOSPITAL_COMMUNITY)

## 2023-10-03 ENCOUNTER — Ambulatory Visit (HOSPITAL_COMMUNITY)

## 2023-10-07 ENCOUNTER — Other Ambulatory Visit (HOSPITAL_COMMUNITY): Payer: Self-pay

## 2023-10-07 ENCOUNTER — Other Ambulatory Visit: Payer: Self-pay

## 2023-10-10 ENCOUNTER — Ambulatory Visit (HOSPITAL_COMMUNITY)
Admission: RE | Admit: 2023-10-10 | Discharge: 2023-10-10 | Disposition: A | Discharge status: Home / Self Care | Source: Ambulatory Visit | Attending: Gastroenterology | Admitting: Gastroenterology

## 2023-10-10 ENCOUNTER — Other Ambulatory Visit: Payer: Self-pay

## 2023-10-10 DIAGNOSIS — R161 Splenomegaly, not elsewhere classified: Secondary | ICD-10-CM

## 2023-10-10 DIAGNOSIS — R194 Change in bowel habit: Secondary | ICD-10-CM | POA: Diagnosis not present

## 2023-10-10 DIAGNOSIS — R1010 Upper abdominal pain, unspecified: Secondary | ICD-10-CM | POA: Diagnosis not present

## 2023-10-10 DIAGNOSIS — R1013 Epigastric pain: Secondary | ICD-10-CM | POA: Diagnosis not present

## 2023-10-10 DIAGNOSIS — R112 Nausea with vomiting, unspecified: Secondary | ICD-10-CM

## 2023-10-10 MED ORDER — IOHEXOL 300 MG/ML  SOLN
100.0000 mL | Freq: Once | INTRAMUSCULAR | Status: AC | PRN
  Administered 2023-10-10: 100 mL via INTRAVENOUS

## 2023-10-13 ENCOUNTER — Encounter: Payer: Self-pay | Admitting: Gastroenterology

## 2023-10-16 ENCOUNTER — Other Ambulatory Visit (HOSPITAL_COMMUNITY): Payer: Self-pay

## 2023-10-16 ENCOUNTER — Other Ambulatory Visit: Payer: Self-pay

## 2023-10-16 ENCOUNTER — Encounter: Payer: Self-pay | Admitting: Pharmacist

## 2023-10-21 ENCOUNTER — Other Ambulatory Visit (HOSPITAL_COMMUNITY): Payer: Self-pay

## 2023-10-21 ENCOUNTER — Other Ambulatory Visit: Payer: Self-pay

## 2023-10-24 ENCOUNTER — Other Ambulatory Visit (HOSPITAL_COMMUNITY): Payer: Self-pay

## 2023-11-09 ENCOUNTER — Other Ambulatory Visit: Payer: Self-pay | Admitting: Family Medicine

## 2023-11-09 DIAGNOSIS — Z3042 Encounter for surveillance of injectable contraceptive: Secondary | ICD-10-CM

## 2023-11-10 ENCOUNTER — Other Ambulatory Visit (HOSPITAL_COMMUNITY): Payer: Self-pay

## 2023-11-10 MED ORDER — MEDROXYPROGESTERONE ACETATE 150 MG/ML IM SUSY
150.0000 mg | PREFILLED_SYRINGE | INTRAMUSCULAR | 1 refills | Status: DC
  Filled 2023-11-10: qty 1, 90d supply, fill #0
  Filled 2024-02-29: qty 1, 90d supply, fill #1

## 2023-11-11 ENCOUNTER — Other Ambulatory Visit: Payer: Self-pay

## 2023-11-11 ENCOUNTER — Other Ambulatory Visit (HOSPITAL_COMMUNITY): Payer: Self-pay

## 2023-11-12 ENCOUNTER — Other Ambulatory Visit (HOSPITAL_COMMUNITY): Payer: Self-pay

## 2023-11-13 ENCOUNTER — Other Ambulatory Visit (HOSPITAL_COMMUNITY): Payer: Self-pay

## 2023-11-26 DIAGNOSIS — S0990XA Unspecified injury of head, initial encounter: Secondary | ICD-10-CM | POA: Diagnosis not present

## 2023-11-26 DIAGNOSIS — S59912A Unspecified injury of left forearm, initial encounter: Secondary | ICD-10-CM | POA: Diagnosis not present

## 2023-11-26 DIAGNOSIS — S199XXA Unspecified injury of neck, initial encounter: Secondary | ICD-10-CM | POA: Diagnosis not present

## 2023-11-26 DIAGNOSIS — R0689 Other abnormalities of breathing: Secondary | ICD-10-CM | POA: Diagnosis not present

## 2023-11-26 DIAGNOSIS — S4992XA Unspecified injury of left shoulder and upper arm, initial encounter: Secondary | ICD-10-CM | POA: Diagnosis not present

## 2023-11-26 DIAGNOSIS — G4489 Other headache syndrome: Secondary | ICD-10-CM | POA: Diagnosis not present

## 2023-11-26 DIAGNOSIS — M25539 Pain in unspecified wrist: Secondary | ICD-10-CM | POA: Diagnosis not present

## 2023-11-26 DIAGNOSIS — S299XXA Unspecified injury of thorax, initial encounter: Secondary | ICD-10-CM | POA: Diagnosis not present

## 2023-11-26 DIAGNOSIS — Z23 Encounter for immunization: Secondary | ICD-10-CM | POA: Diagnosis not present

## 2023-11-26 DIAGNOSIS — S80211A Abrasion, right knee, initial encounter: Secondary | ICD-10-CM | POA: Diagnosis not present

## 2023-11-26 DIAGNOSIS — S60512A Abrasion of left hand, initial encounter: Secondary | ICD-10-CM | POA: Diagnosis not present

## 2023-11-26 DIAGNOSIS — S0993XA Unspecified injury of face, initial encounter: Secondary | ICD-10-CM | POA: Diagnosis not present

## 2023-11-26 DIAGNOSIS — S6992XA Unspecified injury of left wrist, hand and finger(s), initial encounter: Secondary | ICD-10-CM | POA: Diagnosis not present

## 2023-11-26 DIAGNOSIS — M25532 Pain in left wrist: Secondary | ICD-10-CM | POA: Diagnosis not present

## 2023-11-27 ENCOUNTER — Encounter: Payer: Self-pay | Admitting: Family Medicine

## 2023-11-27 ENCOUNTER — Ambulatory Visit: Payer: Self-pay
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
## 2023-12-01 ENCOUNTER — Encounter: Payer: Self-pay | Admitting: Family Medicine

## 2023-12-01 ENCOUNTER — Encounter (HOSPITAL_COMMUNITY): Payer: Self-pay

## 2023-12-01 ENCOUNTER — Ambulatory Visit: Admitting: Family Medicine

## 2023-12-01 VITALS — BP 131/85 | HR 102 | Ht 67.0 in | Wt 321.4 lb

## 2023-12-01 DIAGNOSIS — E119 Type 2 diabetes mellitus without complications: Secondary | ICD-10-CM | POA: Diagnosis not present

## 2023-12-01 DIAGNOSIS — E782 Mixed hyperlipidemia: Secondary | ICD-10-CM | POA: Diagnosis not present

## 2023-12-01 DIAGNOSIS — S060X1D Concussion with loss of consciousness of 30 minutes or less, subsequent encounter: Secondary | ICD-10-CM | POA: Diagnosis not present

## 2023-12-01 DIAGNOSIS — Z7985 Long-term (current) use of injectable non-insulin antidiabetic drugs: Secondary | ICD-10-CM

## 2023-12-01 DIAGNOSIS — F0781 Postconcussional syndrome: Secondary | ICD-10-CM | POA: Insufficient documentation

## 2023-12-01 DIAGNOSIS — M25532 Pain in left wrist: Secondary | ICD-10-CM | POA: Insufficient documentation

## 2023-12-01 DIAGNOSIS — M7989 Other specified soft tissue disorders: Secondary | ICD-10-CM | POA: Insufficient documentation

## 2023-12-01 DIAGNOSIS — S060X1A Concussion with loss of consciousness of 30 minutes or less, initial encounter: Secondary | ICD-10-CM | POA: Insufficient documentation
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
## 2023-12-01 NOTE — Assessment & Plan Note (Signed)
 Normal wrist x ray.  Exam appears mild with slight ttp on exam near anatomic snuff box. Normal rom and strength and sensation.  Consider repeat imaging in 1 month.  Recommended wearing a wrist splint until then.
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
## 2023-12-01 NOTE — Assessment & Plan Note (Deleted)
 Normal wrist x ray.  Exam appears mild with slight ttp on exam near anatomic snuff box. Normal rom and strength and sensation.  Consider repeat imaging in 1 month.  Recommended wearing a wrist splint until then.

## 2023-12-01 NOTE — Assessment & Plan Note (Signed)
 Rechecking A1c today. Taking semaglutide .

## 2023-12-01 NOTE — Patient Instructions (Signed)
 It was nice to see you today,  We addressed the following topics today: -I am sitting in referral to physical medicine and rehab.  If you have not heard from them in the next 2 weeks let us  know - For pain relief you can take 1000 mg of Tylenol every 8 hours, alternating with 600 mg of ibuprofen  every 8 hours.  If neither of these are enough you can take the muscle relaxant.  You can also use heat or ice as needed - Symptoms can linger for several weeks with concussions. - I am ordering a blood test to rule out a blood clot but I do not believe you have a blood clot that is causing your left leg to be more swollen.  Have a great day,  Etha Henle, MD

## 2023-12-02 ENCOUNTER — Encounter: Payer: Self-pay | Admitting: Family Medicine

## 2023-12-02 ENCOUNTER — Encounter: Payer: Self-pay | Admitting: Physical Medicine and Rehabilitation

## 2023-12-02 ENCOUNTER — Other Ambulatory Visit: Payer: Self-pay | Admitting: Family Medicine

## 2023-12-02 ENCOUNTER — Ambulatory Visit: Payer: Self-pay | Admitting: Family Medicine

## 2023-12-02 DIAGNOSIS — M7989 Other specified soft tissue disorders: Secondary | ICD-10-CM

## 2023-12-02 LAB — COMPREHENSIVE METABOLIC PANEL WITH GFR
ALT: 21 IU/L (ref 0–32)
AST: 27 IU/L (ref 0–40)
Albumin: 4.3 g/dL (ref 4.0–5.0)
Alkaline Phosphatase: 74 IU/L (ref 42–106)
BUN/Creatinine Ratio: 13 (ref 9–23)
BUN: 8 mg/dL (ref 6–20)
Bilirubin Total: 0.3 mg/dL (ref 0.0–1.2)
CO2: 21 mmol/L (ref 20–29)
Calcium: 9.4 mg/dL (ref 8.7–10.2)
Chloride: 103 mmol/L (ref 96–106)
Creatinine, Ser: 0.6 mg/dL (ref 0.57–1.00)
Globulin, Total: 2.6 g/dL (ref 1.5–4.5)
Glucose: 84 mg/dL (ref 70–99)
Potassium: 4.4 mmol/L (ref 3.5–5.2)
Sodium: 140 mmol/L (ref 134–144)
Total Protein: 6.9 g/dL (ref 6.0–8.5)
eGFR: 132 mL/min/{1.73_m2} (ref >59)

## 2023-12-02 LAB — CBC
Hematocrit: 40 % (ref 34.0–46.6)
Hemoglobin: 13.2 g/dL (ref 11.1–15.9)
MCH: 28.1 pg (ref 26.6–33.0)
MCHC: 33 g/dL (ref 31.5–35.7)
MCV: 85 fL (ref 79–97)
Platelets: 306 10*3/uL (ref 150–450)
RBC: 4.7 x10E6/uL (ref 3.77–5.28)
RDW: 13.3 % (ref 11.7–15.4)
WBC: 10.7 10*3/uL (ref 3.4–10.8)

## 2023-12-02 LAB — HEMOGLOBIN A1C
Est. average glucose Bld gHb Est-mCnc: 105 mg/dL
Hgb A1c MFr Bld: 5.3 % (ref 4.8–5.6)

## 2023-12-02 LAB — D-DIMER, QUANTITATIVE: D-DIMER: 0.78 mg{FEU}/L — ABNORMAL HIGH (ref 0.00–0.49)

## 2023-12-04 ENCOUNTER — Other Ambulatory Visit: Payer: Self-pay | Admitting: Family Medicine

## 2023-12-04 NOTE — Telephone Encounter (Signed)
 Change the referral to sports medicine and choose Kayla Kelly and use the diagnosis as concussion and they will take care of getting her set up.

## 2023-12-04 NOTE — Addendum Note (Signed)
 Addended by: Laneta Pintos on: 12/04/2023 12:13 PM   Modules accepted: Orders

## 2023-12-05 ENCOUNTER — Other Ambulatory Visit: Payer: Self-pay

## 2023-12-05 ENCOUNTER — Other Ambulatory Visit (HOSPITAL_COMMUNITY): Payer: Self-pay

## 2023-12-05 ENCOUNTER — Emergency Department (HOSPITAL_COMMUNITY)

## 2023-12-05 ENCOUNTER — Encounter (HOSPITAL_COMMUNITY): Payer: Self-pay | Admitting: Emergency Medicine

## 2023-12-05 ENCOUNTER — Emergency Department (HOSPITAL_COMMUNITY)
Admission: EM | Admit: 2023-12-05 | Discharge: 2023-12-05 | Disposition: A | Discharge status: Home / Self Care | Attending: Emergency Medicine | Admitting: Emergency Medicine

## 2023-12-05 DIAGNOSIS — Y9355 Activity, bike riding: Secondary | ICD-10-CM | POA: Diagnosis not present

## 2023-12-05 DIAGNOSIS — E119 Type 2 diabetes mellitus without complications: Secondary | ICD-10-CM | POA: Diagnosis not present

## 2023-12-05 DIAGNOSIS — M545 Low back pain, unspecified: Secondary | ICD-10-CM | POA: Insufficient documentation

## 2023-12-05 DIAGNOSIS — M5459 Other low back pain: Secondary | ICD-10-CM | POA: Diagnosis not present

## 2023-12-05 LAB — URINALYSIS, W/ REFLEX TO CULTURE (INFECTION SUSPECTED)
Bacteria, UA: NONE SEEN
Bilirubin Urine: NEGATIVE
Glucose, UA: NEGATIVE mg/dL
Hgb urine dipstick: NEGATIVE
Ketones, ur: NEGATIVE mg/dL
Leukocytes,Ua: NEGATIVE
Nitrite: NEGATIVE
Protein, ur: NEGATIVE mg/dL
Specific Gravity, Urine: 1.018 (ref 1.005–1.030)
pH: 5 (ref 5.0–8.0)

## 2023-12-05 LAB — PREGNANCY, URINE: Preg Test, Ur: NEGATIVE

## 2023-12-05 MED ORDER — METHOCARBAMOL 500 MG PO TABS
500.0000 mg | ORAL_TABLET | Freq: Once | ORAL | Status: AC
  Administered 2023-12-05: 500 mg via ORAL
  Filled 2023-12-05: qty 1

## 2023-12-05 MED ORDER — METHOCARBAMOL 500 MG PO TABS
500.0000 mg | ORAL_TABLET | Freq: Two times a day (BID) | ORAL | 0 refills | Status: AC
  Filled 2023-12-05 (×2): qty 20, 10d supply, fill #0

## 2023-12-05 MED ORDER — ACETAMINOPHEN 500 MG PO TABS
1000.0000 mg | ORAL_TABLET | Freq: Once | ORAL | Status: AC
  Administered 2023-12-05: 1000 mg via ORAL
  Filled 2023-12-05: qty 2

## 2023-12-05 MED ORDER — LIDOCAINE 5 % EX PTCH
1.0000 | MEDICATED_PATCH | CUTANEOUS | Status: DC
Start: 2023-12-05 — End: 2023-12-05
  Administered 2023-12-05: 1 via TRANSDERMAL
  Filled 2023-12-05: qty 1

## 2023-12-05 MED ORDER — KETOROLAC TROMETHAMINE 30 MG/ML IJ SOLN
30.0000 mg | Freq: Once | INTRAMUSCULAR | Status: AC
  Administered 2023-12-05: 30 mg via INTRAMUSCULAR
  Filled 2023-12-05: qty 1

## 2023-12-05 MED ORDER — ACETAMINOPHEN 500 MG PO TABS
1000.0000 mg | ORAL_TABLET | Freq: Once | ORAL | Status: DC
  Filled 2023-12-05: qty 2

## 2023-12-05 NOTE — ED Notes (Signed)
 HCG quant discontinued by physician.
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
## 2023-12-05 NOTE — Discharge Instructions (Addendum)
 While you were in the emergency room, you have a CT scan of your back that was normal.  I think that you likely have some musculoskeletal back pain.  Symptoms tend to improve over 1 to 2 weeks.  Can take Motrin  and Tylenol at home.  Have also sent a prescription for Robaxin.  Do not drive you are taking this medication as it can sometimes make people sleepy.  Follow-up with your primary care doctor within 1 week.
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
## 2023-12-08 ENCOUNTER — Ambulatory Visit: Admitting: Family Medicine

## 2023-12-10 ENCOUNTER — Ambulatory Visit (INDEPENDENT_AMBULATORY_CARE_PROVIDER_SITE_OTHER)

## 2023-12-10 ENCOUNTER — Ambulatory Visit: Admitting: Family Medicine

## 2023-12-10 ENCOUNTER — Encounter: Payer: Self-pay | Admitting: Family Medicine

## 2023-12-10 VITALS — BP 134/86 | HR 102 | Ht 67.0 in | Wt 318.0 lb

## 2023-12-10 DIAGNOSIS — M25532 Pain in left wrist: Secondary | ICD-10-CM

## 2023-12-10 DIAGNOSIS — F0781 Postconcussional syndrome: Secondary | ICD-10-CM

## 2023-12-10 DIAGNOSIS — G8929 Other chronic pain: Secondary | ICD-10-CM | POA: Diagnosis not present

## 2023-12-10 DIAGNOSIS — M545 Low back pain, unspecified: Secondary | ICD-10-CM | POA: Diagnosis not present

## 2023-12-10 DIAGNOSIS — R4789 Other speech disturbances: Secondary | ICD-10-CM | POA: Diagnosis not present
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
## 2023-12-10 NOTE — Patient Instructions (Addendum)
 Thank you for coming in today.   A referral for physical and speech therapy has been submitted. A representative from the physical therapy office will contact you to coordinate scheduling after confirming your benefits with your insurance provider. If you do not hear from the physical therapy office within the next 1-2 weeks, please let us  know.   Thumb Spica provided today.   See you back in 2 weeks

## 2023-12-11 ENCOUNTER — Other Ambulatory Visit: Payer: Self-pay

## 2023-12-12 ENCOUNTER — Ambulatory Visit: Attending: Family Medicine

## 2023-12-12 ENCOUNTER — Other Ambulatory Visit: Payer: Self-pay

## 2023-12-12 ENCOUNTER — Telehealth: Payer: Self-pay | Admitting: Family Medicine

## 2023-12-12 DIAGNOSIS — G8929 Other chronic pain: Secondary | ICD-10-CM | POA: Insufficient documentation

## 2023-12-12 DIAGNOSIS — M6281 Muscle weakness (generalized): Secondary | ICD-10-CM

## 2023-12-12 DIAGNOSIS — R262 Difficulty in walking, not elsewhere classified: Secondary | ICD-10-CM

## 2023-12-12 DIAGNOSIS — M545 Low back pain, unspecified: Secondary | ICD-10-CM | POA: Diagnosis not present

## 2023-12-12 DIAGNOSIS — M5459 Other low back pain: Secondary | ICD-10-CM

## 2023-12-12 DIAGNOSIS — R252 Cramp and spasm: Secondary | ICD-10-CM

## 2023-12-12 NOTE — Telephone Encounter (Signed)
 I think that is the fastest location currently for speech therapy.
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
## 2023-12-12 NOTE — Telephone Encounter (Signed)
 Patient called and said she could not get into speech therapy until mid June and Dr. Alease Hunter told her to call back to see if there is somewhere else she could go.

## 2023-12-18 ENCOUNTER — Ambulatory Visit: Payer: Self-pay | Admitting: Family Medicine

## 2023-12-18 NOTE — Progress Notes (Signed)
 Left wrist x-ray shows no fracture.  No arthritis.

## 2023-12-21 ENCOUNTER — Encounter: Payer: Self-pay | Admitting: Family Medicine

## 2023-12-22 NOTE — Telephone Encounter (Signed)
Pt r/s for 6/5.

## 2023-12-23 ENCOUNTER — Encounter: Admitting: Family Medicine

## 2023-12-25 ENCOUNTER — Other Ambulatory Visit: Payer: Self-pay

## 2023-12-25 ENCOUNTER — Ambulatory Visit: Admitting: Family Medicine

## 2023-12-25 ENCOUNTER — Other Ambulatory Visit (HOSPITAL_COMMUNITY): Payer: Self-pay

## 2023-12-25 ENCOUNTER — Encounter: Payer: Self-pay | Admitting: Family Medicine

## 2023-12-25 VITALS — BP 136/86 | HR 120 | Ht 67.0 in | Wt 325.0 lb

## 2023-12-25 DIAGNOSIS — F0781 Postconcussional syndrome: Secondary | ICD-10-CM | POA: Diagnosis not present

## 2023-12-25 DIAGNOSIS — F411 Generalized anxiety disorder: Secondary | ICD-10-CM

## 2023-12-25 DIAGNOSIS — G43719 Chronic migraine without aura, intractable, without status migrainosus: Secondary | ICD-10-CM | POA: Diagnosis not present

## 2023-12-25 DIAGNOSIS — G8929 Other chronic pain: Secondary | ICD-10-CM | POA: Diagnosis not present

## 2023-12-25 DIAGNOSIS — F332 Major depressive disorder, recurrent severe without psychotic features: Secondary | ICD-10-CM

## 2023-12-25 DIAGNOSIS — M545 Low back pain, unspecified: Secondary | ICD-10-CM

## 2023-12-25 MED ORDER — TOPIRAMATE ER 25 MG PO CAP24
25.0000 mg | ORAL_CAPSULE | Freq: Every day | ORAL | 2 refills | Status: DC
  Filled 2023-12-25: qty 30, 30d supply, fill #0

## 2023-12-25 MED ORDER — CLONAZEPAM 0.5 MG PO TABS
0.5000 mg | ORAL_TABLET | Freq: Two times a day (BID) | ORAL | 1 refills | Status: DC | PRN
Start: 2023-12-25 — End: 2024-03-27
  Filled 2023-12-25: qty 60, 30d supply, fill #0
  Filled 2023-12-29: qty 60, 30d supply, fill #1
  Filled 2024-02-05 (×3): qty 60, 30d supply, fill #2
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
## 2023-12-25 NOTE — Patient Instructions (Addendum)
 Thank you for coming in today.   I've referred you to Behavioral Therapy  Medication sent to your pharmacy  Check back in 2 weeks

## 2023-12-26 ENCOUNTER — Other Ambulatory Visit: Payer: Self-pay

## 2023-12-26 ENCOUNTER — Other Ambulatory Visit (HOSPITAL_COMMUNITY): Payer: Self-pay

## 2023-12-27 ENCOUNTER — Other Ambulatory Visit (HOSPITAL_COMMUNITY): Payer: Self-pay

## 2023-12-29 ENCOUNTER — Other Ambulatory Visit (HOSPITAL_COMMUNITY): Payer: Self-pay

## 2023-12-30 ENCOUNTER — Other Ambulatory Visit (HOSPITAL_COMMUNITY): Payer: Self-pay
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
## 2023-12-31 ENCOUNTER — Other Ambulatory Visit (HOSPITAL_COMMUNITY): Payer: Self-pay

## 2023-12-31 ENCOUNTER — Ambulatory Visit: Attending: Family Medicine | Admitting: Physical Therapy

## 2023-12-31 ENCOUNTER — Encounter: Payer: Self-pay | Admitting: Physical Therapy

## 2023-12-31 DIAGNOSIS — M5459 Other low back pain: Secondary | ICD-10-CM | POA: Diagnosis not present

## 2023-12-31 DIAGNOSIS — M6281 Muscle weakness (generalized): Secondary | ICD-10-CM | POA: Insufficient documentation

## 2023-12-31 DIAGNOSIS — R262 Difficulty in walking, not elsewhere classified: Secondary | ICD-10-CM | POA: Insufficient documentation

## 2023-12-31 DIAGNOSIS — R252 Cramp and spasm: Secondary | ICD-10-CM | POA: Insufficient documentation

## 2023-12-31 DIAGNOSIS — R293 Abnormal posture: Secondary | ICD-10-CM | POA: Diagnosis not present

## 2024-01-01 ENCOUNTER — Other Ambulatory Visit (HOSPITAL_COMMUNITY): Payer: Self-pay

## 2024-01-05 ENCOUNTER — Ambulatory Visit: Payer: Self-pay

## 2024-01-05 ENCOUNTER — Other Ambulatory Visit: Payer: Self-pay | Admitting: Family Medicine

## 2024-01-05 DIAGNOSIS — E119 Type 2 diabetes mellitus without complications: Secondary | ICD-10-CM

## 2024-01-07 ENCOUNTER — Ambulatory Visit

## 2024-01-07 DIAGNOSIS — M5459 Other low back pain: Secondary | ICD-10-CM | POA: Diagnosis not present

## 2024-01-07 DIAGNOSIS — R262 Difficulty in walking, not elsewhere classified: Secondary | ICD-10-CM

## 2024-01-07 DIAGNOSIS — M6281 Muscle weakness (generalized): Secondary | ICD-10-CM | POA: Diagnosis not present

## 2024-01-07 DIAGNOSIS — R293 Abnormal posture: Secondary | ICD-10-CM

## 2024-01-07 DIAGNOSIS — R252 Cramp and spasm: Secondary | ICD-10-CM | POA: Diagnosis not present
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
## 2024-01-08 ENCOUNTER — Other Ambulatory Visit: Payer: Self-pay

## 2024-01-08 ENCOUNTER — Other Ambulatory Visit (HOSPITAL_COMMUNITY): Payer: Self-pay

## 2024-01-08 ENCOUNTER — Ambulatory Visit (INDEPENDENT_AMBULATORY_CARE_PROVIDER_SITE_OTHER): Admitting: Family Medicine

## 2024-01-08 VITALS — BP 132/88 | HR 104 | Ht 67.0 in | Wt 328.0 lb

## 2024-01-08 DIAGNOSIS — F411 Generalized anxiety disorder: Secondary | ICD-10-CM

## 2024-01-08 DIAGNOSIS — G8929 Other chronic pain: Secondary | ICD-10-CM

## 2024-01-08 DIAGNOSIS — M545 Low back pain, unspecified: Secondary | ICD-10-CM

## 2024-01-08 DIAGNOSIS — F909 Attention-deficit hyperactivity disorder, unspecified type: Secondary | ICD-10-CM | POA: Insufficient documentation

## 2024-01-08 DIAGNOSIS — F902 Attention-deficit hyperactivity disorder, combined type: Secondary | ICD-10-CM | POA: Diagnosis not present

## 2024-01-08 DIAGNOSIS — F0781 Postconcussional syndrome: Secondary | ICD-10-CM

## 2024-01-08 DIAGNOSIS — G43719 Chronic migraine without aura, intractable, without status migrainosus: Secondary | ICD-10-CM | POA: Diagnosis not present

## 2024-01-08 DIAGNOSIS — R41840 Attention and concentration deficit: Secondary | ICD-10-CM

## 2024-01-08 MED ORDER — TOPIRAMATE ER 50 MG PO CAP24
50.0000 mg | ORAL_CAPSULE | Freq: Every day | ORAL | 1 refills | Status: DC
  Filled 2024-01-08: qty 30, 30d supply, fill #0

## 2024-01-08 MED ORDER — LISDEXAMFETAMINE DIMESYLATE 20 MG PO CAPS
20.0000 mg | ORAL_CAPSULE | ORAL | 0 refills | Status: DC
  Filled 2024-01-08: qty 30, 30d supply, fill #0
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
## 2024-01-08 NOTE — Patient Instructions (Addendum)
 Thank you for coming in today.   You can take the clonazepam  once in the morning and once at lunch time.  I  increased your Trokendi  dose  I've sent a prescription for Vyvanse to your pharmacy.   Check back in 3 weeks

## 2024-01-09 ENCOUNTER — Other Ambulatory Visit: Payer: Self-pay

## 2024-01-09 ENCOUNTER — Other Ambulatory Visit (HOSPITAL_COMMUNITY): Payer: Self-pay
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
## 2024-01-12 ENCOUNTER — Ambulatory Visit: Admitting: Physical Therapy

## 2024-01-12 ENCOUNTER — Encounter: Payer: Self-pay | Admitting: Physical Therapy

## 2024-01-12 ENCOUNTER — Encounter: Payer: Self-pay | Admitting: Family Medicine

## 2024-01-12 DIAGNOSIS — R252 Cramp and spasm: Secondary | ICD-10-CM

## 2024-01-12 DIAGNOSIS — R293 Abnormal posture: Secondary | ICD-10-CM | POA: Diagnosis not present

## 2024-01-12 DIAGNOSIS — M5459 Other low back pain: Secondary | ICD-10-CM

## 2024-01-12 DIAGNOSIS — R262 Difficulty in walking, not elsewhere classified: Secondary | ICD-10-CM | POA: Diagnosis not present

## 2024-01-12 DIAGNOSIS — M6281 Muscle weakness (generalized): Secondary | ICD-10-CM

## 2024-01-14 ENCOUNTER — Other Ambulatory Visit: Payer: Self-pay | Admitting: Family Medicine

## 2024-01-14 ENCOUNTER — Other Ambulatory Visit: Payer: Self-pay

## 2024-01-14 ENCOUNTER — Encounter: Payer: Self-pay | Admitting: Family Medicine

## 2024-01-14 ENCOUNTER — Other Ambulatory Visit (HOSPITAL_COMMUNITY): Payer: Self-pay

## 2024-01-14 DIAGNOSIS — E119 Type 2 diabetes mellitus without complications: Secondary | ICD-10-CM

## 2024-01-14 MED ORDER — PROPRANOLOL HCL ER 60 MG PO CP24
60.0000 mg | ORAL_CAPSULE | Freq: Every day | ORAL | 1 refills | Status: DC
  Filled 2024-01-14: qty 30, 30d supply, fill #0
  Filled 2024-02-05 – 2024-02-09 (×2): qty 30, 30d supply, fill #1

## 2024-01-14 MED ORDER — OZEMPIC (2 MG/DOSE) 8 MG/3ML ~~LOC~~ SOPN
2.0000 mg | PEN_INJECTOR | SUBCUTANEOUS | 3 refills | Status: DC
  Filled 2024-01-14: qty 3, 28d supply, fill #0

## 2024-01-15 ENCOUNTER — Other Ambulatory Visit (HOSPITAL_COMMUNITY): Payer: Self-pay
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
## 2024-01-19 ENCOUNTER — Other Ambulatory Visit (HOSPITAL_COMMUNITY): Payer: Self-pay

## 2024-01-19 ENCOUNTER — Other Ambulatory Visit: Payer: Self-pay | Admitting: Family Medicine

## 2024-01-19 ENCOUNTER — Ambulatory Visit: Admitting: Physical Therapy

## 2024-01-19 ENCOUNTER — Encounter: Payer: Self-pay | Admitting: Physical Therapy

## 2024-01-19 DIAGNOSIS — R252 Cramp and spasm: Secondary | ICD-10-CM

## 2024-01-19 DIAGNOSIS — M6281 Muscle weakness (generalized): Secondary | ICD-10-CM

## 2024-01-19 DIAGNOSIS — M5459 Other low back pain: Secondary | ICD-10-CM

## 2024-01-19 DIAGNOSIS — E119 Type 2 diabetes mellitus without complications: Secondary | ICD-10-CM

## 2024-01-19 DIAGNOSIS — R262 Difficulty in walking, not elsewhere classified: Secondary | ICD-10-CM | POA: Diagnosis not present

## 2024-01-19 DIAGNOSIS — R293 Abnormal posture: Secondary | ICD-10-CM | POA: Diagnosis not present

## 2024-01-19 MED ORDER — OZEMPIC (2 MG/DOSE) 8 MG/3ML ~~LOC~~ SOPN
2.0000 mg | PEN_INJECTOR | SUBCUTANEOUS | 3 refills | Status: DC
Start: 2024-01-19 — End: 2024-05-31
  Filled 2024-01-19 – 2024-02-09 (×4): qty 3, 28d supply, fill #0
  Filled 2024-03-09: qty 3, 28d supply, fill #1
  Filled 2024-04-03: qty 3, 28d supply, fill #2
  Filled 2024-05-02: qty 3, 28d supply, fill #3

## 2024-01-22 ENCOUNTER — Ambulatory Visit: Attending: Family Medicine

## 2024-01-22 ENCOUNTER — Encounter: Payer: Self-pay | Admitting: Family Medicine

## 2024-01-22 ENCOUNTER — Telehealth: Payer: Self-pay

## 2024-01-22 DIAGNOSIS — R252 Cramp and spasm: Secondary | ICD-10-CM | POA: Diagnosis not present

## 2024-01-22 DIAGNOSIS — M6281 Muscle weakness (generalized): Secondary | ICD-10-CM | POA: Insufficient documentation

## 2024-01-22 DIAGNOSIS — R293 Abnormal posture: Secondary | ICD-10-CM | POA: Insufficient documentation

## 2024-01-22 DIAGNOSIS — M5459 Other low back pain: Secondary | ICD-10-CM | POA: Diagnosis not present

## 2024-01-22 DIAGNOSIS — R262 Difficulty in walking, not elsewhere classified: Secondary | ICD-10-CM | POA: Diagnosis not present

## 2024-01-22 NOTE — Telephone Encounter (Signed)
Disability form received
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
## 2024-01-26 ENCOUNTER — Encounter: Payer: Self-pay | Admitting: Physical Medicine and Rehabilitation

## 2024-01-26 NOTE — Telephone Encounter (Signed)
 Possible accommodations:

## 2024-01-26 NOTE — Telephone Encounter (Signed)
Called pt, left VM to call the office.  
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
## 2024-01-27 NOTE — Telephone Encounter (Signed)
 Form completed, reviewed and signed by Dr. Alease Hunter, placed at the front desk for faxing/scanning.

## 2024-01-28 ENCOUNTER — Encounter: Payer: Self-pay | Admitting: Physical Therapy

## 2024-01-28 ENCOUNTER — Ambulatory Visit: Admitting: Physical Therapy

## 2024-01-28 DIAGNOSIS — M6281 Muscle weakness (generalized): Secondary | ICD-10-CM

## 2024-01-28 DIAGNOSIS — R252 Cramp and spasm: Secondary | ICD-10-CM

## 2024-01-28 DIAGNOSIS — M5459 Other low back pain: Secondary | ICD-10-CM

## 2024-01-28 DIAGNOSIS — R262 Difficulty in walking, not elsewhere classified: Secondary | ICD-10-CM

## 2024-01-28 DIAGNOSIS — R293 Abnormal posture: Secondary | ICD-10-CM | POA: Diagnosis not present

## 2024-01-29 ENCOUNTER — Encounter: Payer: Self-pay | Admitting: Family Medicine

## 2024-01-29 ENCOUNTER — Ambulatory Visit: Admitting: Family Medicine

## 2024-01-29 ENCOUNTER — Other Ambulatory Visit: Payer: Self-pay

## 2024-01-29 VITALS — BP 136/88 | HR 98 | Ht 67.0 in | Wt 333.0 lb

## 2024-01-29 DIAGNOSIS — F902 Attention-deficit hyperactivity disorder, combined type: Secondary | ICD-10-CM

## 2024-01-29 DIAGNOSIS — F0781 Postconcussional syndrome: Secondary | ICD-10-CM

## 2024-01-29 DIAGNOSIS — F411 Generalized anxiety disorder: Secondary | ICD-10-CM | POA: Diagnosis not present

## 2024-01-29 DIAGNOSIS — G43719 Chronic migraine without aura, intractable, without status migrainosus: Secondary | ICD-10-CM | POA: Diagnosis not present

## 2024-01-29 MED ORDER — TOPIRAMATE ER 100 MG PO CAP24
100.0000 mg | ORAL_CAPSULE | Freq: Every day | ORAL | 3 refills | Status: DC
  Filled 2024-01-29: qty 30, 30d supply, fill #0
  Filled 2024-02-29: qty 30, 30d supply, fill #1
  Filled 2024-03-26: qty 30, 30d supply, fill #2
  Filled 2024-04-26: qty 30, 30d supply, fill #3
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
## 2024-01-29 NOTE — Patient Instructions (Addendum)
 Thank you for coming in today.   Plan to increase Trokendi    Try increasing the Vyvanse  to 40mg  and report back how you feel.   Look at [facility] to find a therapist that specializes in PTSD and cognitive behavioral therapy  Let me know by the end of July any refills you need  Reminder: Dr. Joane will be out of the office starting August 1st, for about 6 weeks

## 2024-02-02 NOTE — Telephone Encounter (Signed)
Forwarding to Dr. Corey to review.  

## 2024-02-04 ENCOUNTER — Encounter: Payer: Self-pay | Admitting: Physical Therapy

## 2024-02-04 ENCOUNTER — Other Ambulatory Visit (HOSPITAL_COMMUNITY): Payer: Self-pay

## 2024-02-04 ENCOUNTER — Other Ambulatory Visit: Payer: Self-pay

## 2024-02-04 ENCOUNTER — Ambulatory Visit: Admitting: Physical Therapy

## 2024-02-04 DIAGNOSIS — R262 Difficulty in walking, not elsewhere classified: Secondary | ICD-10-CM | POA: Diagnosis not present

## 2024-02-04 DIAGNOSIS — R252 Cramp and spasm: Secondary | ICD-10-CM

## 2024-02-04 DIAGNOSIS — M6281 Muscle weakness (generalized): Secondary | ICD-10-CM

## 2024-02-04 DIAGNOSIS — M5459 Other low back pain: Secondary | ICD-10-CM

## 2024-02-04 DIAGNOSIS — R293 Abnormal posture: Secondary | ICD-10-CM

## 2024-02-04 MED ORDER — LISDEXAMFETAMINE DIMESYLATE 30 MG PO CAPS
30.0000 mg | ORAL_CAPSULE | ORAL | 0 refills | Status: DC
  Filled 2024-02-04 – 2024-02-06 (×3): qty 90, 90d supply, fill #0
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
## 2024-02-05 ENCOUNTER — Other Ambulatory Visit (HOSPITAL_COMMUNITY): Payer: Self-pay

## 2024-02-05 ENCOUNTER — Other Ambulatory Visit: Payer: Self-pay | Admitting: Family Medicine

## 2024-02-05 ENCOUNTER — Other Ambulatory Visit: Payer: Self-pay

## 2024-02-05 DIAGNOSIS — F411 Generalized anxiety disorder: Secondary | ICD-10-CM

## 2024-02-05 MED ORDER — BUPROPION HCL ER (XL) 300 MG PO TB24
300.0000 mg | ORAL_TABLET | Freq: Every day | ORAL | 1 refills | Status: AC
  Filled 2024-02-05: qty 90, 90d supply, fill #0

## 2024-02-06 ENCOUNTER — Other Ambulatory Visit (HOSPITAL_BASED_OUTPATIENT_CLINIC_OR_DEPARTMENT_OTHER): Payer: Self-pay

## 2024-02-06 ENCOUNTER — Other Ambulatory Visit (HOSPITAL_COMMUNITY): Payer: Self-pay

## 2024-02-09 ENCOUNTER — Other Ambulatory Visit (HOSPITAL_COMMUNITY): Payer: Self-pay

## 2024-02-09 ENCOUNTER — Other Ambulatory Visit: Payer: Self-pay

## 2024-02-10 ENCOUNTER — Ambulatory Visit (INDEPENDENT_AMBULATORY_CARE_PROVIDER_SITE_OTHER): Payer: Self-pay | Admitting: Psychology

## 2024-02-10 DIAGNOSIS — F4323 Adjustment disorder with mixed anxiety and depressed mood: Secondary | ICD-10-CM
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
## 2024-02-11 ENCOUNTER — Ambulatory Visit: Admitting: Physical Therapy

## 2024-02-14 ENCOUNTER — Other Ambulatory Visit (HOSPITAL_COMMUNITY): Payer: Self-pay

## 2024-02-19 ENCOUNTER — Ambulatory Visit: Admitting: Psychology

## 2024-02-19 ENCOUNTER — Encounter: Payer: Self-pay | Admitting: Physical Therapy

## 2024-02-19 ENCOUNTER — Ambulatory Visit: Admitting: Physical Therapy

## 2024-02-19 DIAGNOSIS — M5459 Other low back pain: Secondary | ICD-10-CM

## 2024-02-19 DIAGNOSIS — R252 Cramp and spasm: Secondary | ICD-10-CM | POA: Diagnosis not present

## 2024-02-19 DIAGNOSIS — M6281 Muscle weakness (generalized): Secondary | ICD-10-CM

## 2024-02-19 DIAGNOSIS — F4323 Adjustment disorder with mixed anxiety and depressed mood: Secondary | ICD-10-CM

## 2024-02-19 DIAGNOSIS — R293 Abnormal posture: Secondary | ICD-10-CM | POA: Diagnosis not present

## 2024-02-19 DIAGNOSIS — R262 Difficulty in walking, not elsewhere classified: Secondary | ICD-10-CM | POA: Diagnosis not present
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
## 2024-02-25 ENCOUNTER — Ambulatory Visit: Attending: Family Medicine | Admitting: Physical Therapy

## 2024-02-25 ENCOUNTER — Encounter: Payer: Self-pay | Admitting: Physical Therapy

## 2024-02-25 DIAGNOSIS — R293 Abnormal posture: Secondary | ICD-10-CM | POA: Insufficient documentation

## 2024-02-25 DIAGNOSIS — R252 Cramp and spasm: Secondary | ICD-10-CM | POA: Insufficient documentation

## 2024-02-25 DIAGNOSIS — M5459 Other low back pain: Secondary | ICD-10-CM | POA: Insufficient documentation

## 2024-02-25 DIAGNOSIS — M6281 Muscle weakness (generalized): Secondary | ICD-10-CM | POA: Insufficient documentation

## 2024-02-25 DIAGNOSIS — R262 Difficulty in walking, not elsewhere classified: Secondary | ICD-10-CM | POA: Insufficient documentation

## 2024-03-01 ENCOUNTER — Other Ambulatory Visit (HOSPITAL_COMMUNITY): Payer: Self-pay
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
## 2024-03-03 ENCOUNTER — Encounter: Payer: Self-pay | Admitting: Physical Therapy

## 2024-03-03 ENCOUNTER — Ambulatory Visit: Admitting: Physical Therapy

## 2024-03-03 DIAGNOSIS — M5459 Other low back pain: Secondary | ICD-10-CM

## 2024-03-03 DIAGNOSIS — R262 Difficulty in walking, not elsewhere classified: Secondary | ICD-10-CM

## 2024-03-03 DIAGNOSIS — R293 Abnormal posture: Secondary | ICD-10-CM | POA: Diagnosis not present

## 2024-03-03 DIAGNOSIS — M6281 Muscle weakness (generalized): Secondary | ICD-10-CM | POA: Diagnosis not present

## 2024-03-03 DIAGNOSIS — R252 Cramp and spasm: Secondary | ICD-10-CM | POA: Diagnosis not present

## 2024-03-04 ENCOUNTER — Ambulatory Visit: Admitting: Psychology

## 2024-03-09 ENCOUNTER — Other Ambulatory Visit (HOSPITAL_COMMUNITY): Payer: Self-pay

## 2024-03-26 ENCOUNTER — Other Ambulatory Visit: Payer: Self-pay

## 2024-03-27 ENCOUNTER — Other Ambulatory Visit: Payer: Self-pay | Admitting: Family Medicine

## 2024-03-29 ENCOUNTER — Other Ambulatory Visit: Payer: Self-pay

## 2024-03-29 ENCOUNTER — Encounter: Payer: Self-pay | Admitting: Family Medicine

## 2024-03-29 ENCOUNTER — Other Ambulatory Visit (HOSPITAL_COMMUNITY): Payer: Self-pay

## 2024-03-29 MED ORDER — CLONAZEPAM 0.5 MG PO TABS: 0.5000 mg | ORAL_TABLET | Freq: Two times a day (BID) | ORAL | 1 refills | Status: DC | PRN

## 2024-03-29 MED ORDER — CLONAZEPAM 0.5 MG PO TABS
0.5000 mg | ORAL_TABLET | Freq: Two times a day (BID) | ORAL | 0 refills | Status: AC
  Filled 2024-03-29 (×2): qty 180, 90d supply, fill #0

## 2024-03-29 NOTE — Addendum Note (Signed)
 Addended by: MARDY LEOTIS RAMAN on: 03/29/2024 09:30 AM   Modules accepted: Orders

## 2024-03-29 NOTE — Telephone Encounter (Signed)
 Last OV 01/29/24 Next OV 05/06/24  Last refill 12/25/23 Qty #60/1

## 2024-03-30 ENCOUNTER — Other Ambulatory Visit: Payer: Self-pay

## 2024-03-30 ENCOUNTER — Other Ambulatory Visit (HOSPITAL_COMMUNITY): Payer: Self-pay

## 2024-03-30 MED ORDER — CLONAZEPAM 0.5 MG PO TABS
0.5000 mg | ORAL_TABLET | Freq: Two times a day (BID) | ORAL | 1 refills | Status: AC | PRN
  Filled 2024-03-30: qty 60, 30d supply, fill #0

## 2024-03-31 ENCOUNTER — Ambulatory Visit: Admitting: Psychology

## 2024-03-31 DIAGNOSIS — F4323 Adjustment disorder with mixed anxiety and depressed mood: Secondary | ICD-10-CM | POA: Diagnosis not present
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
## 2024-04-03 ENCOUNTER — Other Ambulatory Visit (HOSPITAL_COMMUNITY): Payer: Self-pay

## 2024-04-14 ENCOUNTER — Ambulatory Visit (INDEPENDENT_AMBULATORY_CARE_PROVIDER_SITE_OTHER): Admitting: Psychology

## 2024-04-14 DIAGNOSIS — F4323 Adjustment disorder with mixed anxiety and depressed mood: Secondary | ICD-10-CM
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
## 2024-04-21 ENCOUNTER — Ambulatory Visit (INDEPENDENT_AMBULATORY_CARE_PROVIDER_SITE_OTHER): Admitting: Psychology

## 2024-04-21 DIAGNOSIS — F4323 Adjustment disorder with mixed anxiety and depressed mood: Secondary | ICD-10-CM
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
## 2024-04-26 ENCOUNTER — Other Ambulatory Visit (HOSPITAL_COMMUNITY): Payer: Self-pay

## 2024-04-28 ENCOUNTER — Ambulatory Visit: Admitting: Psychology

## 2024-04-29 ENCOUNTER — Ambulatory Visit (INDEPENDENT_AMBULATORY_CARE_PROVIDER_SITE_OTHER): Admitting: Family Medicine

## 2024-04-29 ENCOUNTER — Other Ambulatory Visit: Payer: Self-pay

## 2024-04-29 ENCOUNTER — Other Ambulatory Visit (HOSPITAL_COMMUNITY): Payer: Self-pay

## 2024-04-29 VITALS — Ht 67.0 in | Wt 297.0 lb

## 2024-04-29 DIAGNOSIS — G43719 Chronic migraine without aura, intractable, without status migrainosus: Secondary | ICD-10-CM

## 2024-04-29 DIAGNOSIS — F0781 Postconcussional syndrome: Secondary | ICD-10-CM

## 2024-04-29 DIAGNOSIS — F902 Attention-deficit hyperactivity disorder, combined type: Secondary | ICD-10-CM | POA: Diagnosis not present

## 2024-04-29 DIAGNOSIS — F411 Generalized anxiety disorder: Secondary | ICD-10-CM

## 2024-04-29 MED ORDER — AIMOVIG 70 MG/ML ~~LOC~~ SOAJ
70.0000 mg | SUBCUTANEOUS | 12 refills | Status: AC
  Filled 2024-04-29 – 2024-04-30 (×2): qty 1, 30d supply, fill #0
  Filled 2024-05-04: qty 2, 60d supply, fill #0
  Filled 2024-07-05: qty 1, 30d supply, fill #1
  Filled 2024-07-26 – 2024-07-29 (×4): qty 1, 30d supply, fill #2

## 2024-04-29 MED ORDER — METHYLPHENIDATE HCL ER (OSM) 54 MG PO TBCR
54.0000 mg | EXTENDED_RELEASE_TABLET | ORAL | 0 refills | Status: DC
  Filled 2024-04-29 – 2024-04-30 (×2): qty 30, 30d supply, fill #0
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
## 2024-04-29 NOTE — Patient Instructions (Addendum)
 Thank you for coming in today.   Medications were sent to your pharmacy  Check back in 1 month via video visit

## 2024-04-30 ENCOUNTER — Telehealth (HOSPITAL_COMMUNITY): Payer: Self-pay

## 2024-04-30 ENCOUNTER — Other Ambulatory Visit (HOSPITAL_BASED_OUTPATIENT_CLINIC_OR_DEPARTMENT_OTHER): Payer: Self-pay

## 2024-04-30 ENCOUNTER — Other Ambulatory Visit (HOSPITAL_COMMUNITY): Payer: Self-pay

## 2024-04-30 ENCOUNTER — Other Ambulatory Visit: Payer: Self-pay

## 2024-04-30 ENCOUNTER — Other Ambulatory Visit: Payer: Self-pay | Admitting: Family Medicine

## 2024-04-30 MED ORDER — METHYLPHENIDATE HCL ER (OSM) 54 MG PO TBCR
54.0000 mg | EXTENDED_RELEASE_TABLET | ORAL | 0 refills | Status: DC
  Filled 2024-04-30: qty 90, 90d supply, fill #0

## 2024-04-30 NOTE — Telephone Encounter (Signed)
 Last OV 04/29/24 Next OV 05/31/24  Last refill 04/29/24 Qty # 30/0

## 2024-05-02 ENCOUNTER — Other Ambulatory Visit (HOSPITAL_COMMUNITY): Payer: Self-pay

## 2024-05-03 ENCOUNTER — Telehealth (HOSPITAL_COMMUNITY): Payer: Self-pay

## 2024-05-03 ENCOUNTER — Other Ambulatory Visit (HOSPITAL_COMMUNITY): Payer: Self-pay

## 2024-05-03 NOTE — Telephone Encounter (Signed)
.  Pharmacy Patient Advocate Encounter  Received notification from [facility] that Prior Authorization for Aimovig 70MG /ML auto-injectors  has been APPROVED from 05/03/24 to 10/31/24. Ran test claim, Copay is $5 (with copay card, without the cost would be $125). This test claim was processed through [facility]- copay amounts may vary at other pharmacies due to pharmacy/plan contracts, or as the patient moves through the different stages of their insurance plan.   PA #/Case ID/Reference #: [id number]

## 2024-05-03 NOTE — Telephone Encounter (Signed)
 PA request has been Received. New Encounter has been or will be created for follow up. For additional info see Pharmacy Prior Auth telephone encounter from 05/03/24.

## 2024-05-03 NOTE — Telephone Encounter (Signed)
 Pharmacy Patient Advocate Encounter   Received notification from Pt Calls Messages that prior authorization for Aimovig 70MG /ML auto-injectors  is required/requested.   Insurance verification completed.   The patient is insured through [facility].   Per test claim: PA required; PA submitted to above mentioned insurance via Latent Key/confirmation #/EOC [id number] Status is pending

## 2024-05-04 ENCOUNTER — Other Ambulatory Visit (HOSPITAL_COMMUNITY): Payer: Self-pay

## 2024-05-05 ENCOUNTER — Other Ambulatory Visit (HOSPITAL_COMMUNITY): Payer: Self-pay

## 2024-05-05 ENCOUNTER — Ambulatory Visit: Admitting: Psychology

## 2024-05-05 DIAGNOSIS — F4323 Adjustment disorder with mixed anxiety and depressed mood: Secondary | ICD-10-CM | POA: Diagnosis not present
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
## 2024-05-06 ENCOUNTER — Encounter: Admitting: Family Medicine

## 2024-05-12 ENCOUNTER — Ambulatory Visit (INDEPENDENT_AMBULATORY_CARE_PROVIDER_SITE_OTHER): Admitting: Psychology

## 2024-05-12 DIAGNOSIS — F4323 Adjustment disorder with mixed anxiety and depressed mood: Secondary | ICD-10-CM
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
## 2024-05-19 ENCOUNTER — Ambulatory Visit (INDEPENDENT_AMBULATORY_CARE_PROVIDER_SITE_OTHER): Admitting: Psychology

## 2024-05-19 ENCOUNTER — Encounter: Payer: Self-pay | Admitting: Family Medicine

## 2024-05-19 DIAGNOSIS — F4323 Adjustment disorder with mixed anxiety and depressed mood: Secondary | ICD-10-CM

## 2024-05-19 NOTE — Progress Notes (Signed)
 [facility] Counselor/Therapist Progress Note  Patient ID: Kayla Kelly, MRN: [id number],    Date: 05/19/2024  Time Spent: 60 mins    start time: 1100    end time: 1200  Treatment Type: Individual Therapy  Reported Symptoms: Pt presents for the session, via Caregility video, granting consent for the session.  Pt goes by Kayla Kelly.  Pt states she is in her bedroom in her apt at college with no one else present.  She understands the limits of virtual sessions.  I shared with pt that I am in my office with no one else here either.    Mental Status Exam: Appearance:  Casual     Behavior: Appropriate  Motor: Normal  Speech/Language:  Clear and Coherent  Affect: Appropriate  Mood: normal  Thought process: normal  Thought content:   WNL  Sensory/Perceptual disturbances:   WNL  Orientation: oriented to person, place, and time/date  Attention: Good  Concentration: Good  Memory: WNL  Fund of knowledge:  Good  Insight:   Good  Judgment:  Good  Impulse Control: Good   Risk Assessment: Danger to Self:  No Self-injurious Behavior: No Danger to Others: No Duty to Warn:no Physical Aggression / Violence:No  Access to Firearms a concern: No  Gang Involvement:No   Subjective: Pt shares she has been well since our last session.  I visited a friend's apt last week and it was a very messy apt and I have had allergy issues since then.  I have been OK since last week.  I went bowling and to a haunted house with friends on Friday night and that was fun.  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  She came home and took a nap and that helped her feel better.  Pt continues to smoke pot before bed most nights and it helps her sleep.  Pt shares that she did text [name] about why he did not walk her to her car last week.  He responded well to the text and took  responsibility for need to have done better.  Pt felt good about the response.  Pt shares she would like to go home this weekend but is not sure if that is a great idea.  Today is her parent's anniversary (either 27th or 28th).  Pt shares that her dog, Kayla Kelly, had surgery recently and is recovering.  She had to have a tendon repaired in her leg.  Pt mentions a good friend of hers, Kayla Kelly, met on [employer], the dating website.  They normally text frequently and pt enjoys her company.  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  She is planning to take an introductory Communications class, Abnormal Psychology, a business class, and a couple of easier classes.  Pt also has to decide soon whether or not she will choose to renew her apt lease for next year; she will need to talk with her parents about that soon.  Pt has been watching favorite TV shows as a means of self care as well.  She has also been intentional about drinking more water.  Encouraged pt to go for walks and use her physical senses while she is outside on her walks.  Encouraged pt to continue with her self care activities and we will meet next week for a follow up session.  Interventions: Cognitive Behavioral Therapy  Diagnosis:Adjustment disorder with mixed anxiety and depressed mood  Plan: Treatment Plan Strengths/Abilities:  Intelligent, Intuitive, Willing to participate in therapy Treatment Preferences:  Outpatient Individual Therapy Statement of Needs:  Patient is to use CBT, mindfulness and coping skills to help manage and/or decrease symptoms associated with their diagnosis. Symptoms:  Depressed/Irritable mood, worry, social withdrawal Problems Addressed:  Depressive thoughts, Sadness, Sleep issues, etc. Long Term Goals:  Pt to reduce overall level, frequency, and intensity of  the feelings of depression/anxiety as evidenced by decreased irritability, negative self talk, and helpless feelings from 6 to 7 days/week to 0 to 1 days/week, per client report, for at least 3 consecutive months.  Progress: 30% Short Term Goals:  Pt to verbally express understanding of the relationship between feelings of depression/anxiety and their impact on thinking patterns and behaviors.  Pt to verbalize an understanding of the role that distorted thinking plays in creating fears, excessive worry, and ruminations.  Progress: 30% Target Date:  02/18/2025 Frequency:  Bi-weekly Modality:  Cognitive Behavioral Therapy Interventions by Therapist:  Therapist will use CBT, Mindfulness exercises, Coping skills and Referrals, as needed by client. Client has verbally approved this treatment plan.  Francis KATHEE Macintosh, [facility]

## 2024-05-23 ENCOUNTER — Other Ambulatory Visit: Payer: Self-pay | Admitting: Family Medicine

## 2024-05-24 ENCOUNTER — Other Ambulatory Visit (HOSPITAL_COMMUNITY): Payer: Self-pay

## 2024-05-24 ENCOUNTER — Other Ambulatory Visit: Payer: Self-pay

## 2024-05-24 MED ORDER — TOPIRAMATE ER 100 MG PO CAP24
100.0000 mg | ORAL_CAPSULE | Freq: Every day | ORAL | 1 refills | Status: AC
  Filled 2024-05-24: qty 90, 90d supply, fill #0
  Filled 2024-08-26: qty 90, 90d supply, fill #1

## 2024-05-24 NOTE — Telephone Encounter (Signed)
 Last OV 04/29/24 Next OV 05/31/24  Last refill 01/29/24 Qty #30/3

## 2024-05-26 ENCOUNTER — Ambulatory Visit (INDEPENDENT_AMBULATORY_CARE_PROVIDER_SITE_OTHER): Admitting: Psychology

## 2024-05-26 DIAGNOSIS — F4323 Adjustment disorder with mixed anxiety and depressed mood: Secondary | ICD-10-CM | POA: Diagnosis not present

## 2024-05-26 NOTE — Progress Notes (Signed)
 [facility] Counselor/Therapist Progress Note  Patient ID: Jesselle Laflamme, MRN: [id number],    Date: 05/26/2024  Time Spent: 60 mins    start time: 1100    end time: 1200  Treatment Type: Individual Therapy  Reported Symptoms: Pt presents for the session, via Caregility video, granting consent for the session.  Pt goes by [name].  Pt states she is in her bedroom in her apt at college with no one else present.  She understands the limits of virtual sessions.  I shared with pt that I am in my office with no one else here either.    Mental Status Exam: Appearance:  Casual     Behavior: Appropriate  Motor: Normal  Speech/Language:  Clear and Coherent  Affect: Appropriate  Mood: normal  Thought process: normal  Thought content:   WNL  Sensory/Perceptual disturbances:   WNL  Orientation: oriented to person, place, and time/date  Attention: Good  Concentration: Good  Memory: WNL  Fund of knowledge:  Good  Insight:   Good  Judgment:  Good  Impulse Control: Good   Risk Assessment: Danger to Self:  No Self-injurious Behavior: No Danger to Others: No Duty to Warn:no Physical Aggression / Violence:No  Access to Firearms a concern: No  Gang Involvement:No   Subjective: Pt shares she has been OK since our last session.  I had several meltdowns yesterday.  One was with my room mate who has her chihuahua at the apt and I learned yesterday that the dog has never been to the vet and has never had any shots.  As a result, I can't babysit anymore dogs at my apt because my room mate is not responsible.  Pt also shares that she has talked with her mom and they decided that it would be best for pt to leave [place] at the end of the semester.  She will be going to [employer] and taking classes as she wants to.  She appreciates the fact that her parents are being as supportive as they are.  She is attempting to get her concussion doctor to help her break the lease so they do not have to pay  for the apt during the Spring semester.  Pt also shares a note from one of her room mates that she considers to be unkind.  The room mate's (Faith) issues are the pt drinking a lot in the apt with friends; pt going in room mate's room; and pt turning the thermostat down and making the other room mates cold.  Pt is very upset at the message; she has not gotten along with with her room mates, Jaylyn and Faith at times.  Pt plans to talk with her room mates with a friend of her's present with her.  Pt continues to drink regularly and smokes pot almost nightly.  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  Pt shares that she went to a party recently and kissed a girl and she enjoyed that.  She also spent time with [name] and that was fun as well.  Pt has been watching favorite TV shows as a means of self care as well.  Pt shares that, as of today, she has lost 46 pounds so far and she feels good about that.  She has also been intentional about drinking more water.  Encouraged pt to go for walks and use her physical senses while she is outside on her walks.  Encouraged pt to continue with her self care activities and we will meet next week for a follow up session.  Interventions: Cognitive Behavioral Therapy  Diagnosis:Adjustment disorder with mixed anxiety and depressed mood  Plan: Treatment Plan Strengths/Abilities:  Intelligent, Intuitive, Willing to participate in therapy Treatment Preferences:  Outpatient Individual Therapy Statement of Needs:  Patient is to use CBT, mindfulness and coping skills to help manage and/or decrease symptoms associated with their diagnosis. Symptoms:  Depressed/Irritable mood, worry, social withdrawal Problems Addressed:  Depressive thoughts, Sadness, Sleep issues, etc. Long Term Goals:  Pt to reduce overall level, frequency, and intensity of the feelings of depression/anxiety as evidenced by decreased irritability,  negative self talk, and helpless feelings from 6 to 7 days/week to 0 to 1 days/week, per client report, for at least 3 consecutive months.  Progress: 30% Short Term Goals:  Pt to verbally express understanding of the relationship between feelings of depression/anxiety and their impact on thinking patterns and behaviors.  Pt to verbalize an understanding of the role that distorted thinking plays in creating fears, excessive worry, and ruminations.  Progress: 30% Target Date:  02/18/2025 Frequency:  Bi-weekly Modality:  Cognitive Behavioral Therapy Interventions by Therapist:  Therapist will use CBT, Mindfulness exercises, Coping skills and Referrals, as needed by client. Client has verbally approved this treatment plan.  Francis KATHEE Macintosh, [facility]

## 2024-05-31 ENCOUNTER — Other Ambulatory Visit: Payer: Self-pay | Admitting: Family Medicine

## 2024-05-31 ENCOUNTER — Other Ambulatory Visit (HOSPITAL_COMMUNITY): Payer: Self-pay

## 2024-05-31 ENCOUNTER — Encounter: Payer: Self-pay | Admitting: Family Medicine

## 2024-05-31 ENCOUNTER — Telehealth (INDEPENDENT_AMBULATORY_CARE_PROVIDER_SITE_OTHER): Admitting: Family Medicine

## 2024-05-31 DIAGNOSIS — F902 Attention-deficit hyperactivity disorder, combined type: Secondary | ICD-10-CM | POA: Diagnosis not present

## 2024-05-31 DIAGNOSIS — G43719 Chronic migraine without aura, intractable, without status migrainosus: Secondary | ICD-10-CM | POA: Diagnosis not present

## 2024-05-31 DIAGNOSIS — F0781 Postconcussional syndrome: Secondary | ICD-10-CM

## 2024-05-31 DIAGNOSIS — E119 Type 2 diabetes mellitus without complications: Secondary | ICD-10-CM

## 2024-05-31 MED ORDER — OZEMPIC (2 MG/DOSE) 8 MG/3ML ~~LOC~~ SOPN
2.0000 mg | PEN_INJECTOR | SUBCUTANEOUS | 3 refills | Status: AC
  Filled 2024-05-31: qty 3, 28d supply, fill #0
  Filled 2024-06-25: qty 3, 28d supply, fill #1
  Filled 2024-07-23 – 2024-07-28 (×2): qty 3, 28d supply, fill #2
  Filled 2024-08-26: qty 3, 28d supply, fill #3

## 2024-05-31 MED ORDER — METHYLPHENIDATE HCL ER (OSM) 63 MG PO TBCR
63.0000 mg | EXTENDED_RELEASE_TABLET | ORAL | 0 refills | Status: DC
  Filled 2024-05-31: qty 90, 90d supply, fill #0

## 2024-05-31 NOTE — Patient Instructions (Signed)
 Thank you for coming in today.   Recheck in 2 months.  Increase Concerta 63 mg.  Continue Aimovig.  Agree with switching schools.

## 2024-05-31 NOTE — Progress Notes (Signed)
 Virtual Visit  via Video Note   I connected with Kayla Kelly  today by a video enabled telemedicine application and verified that I am speaking with the correct person using two identifiers.  ? Location of the provider office  ? Location of the patient home in [place] ? The names and roles of all persons participating in the visit. Patient and myself   I discussed the limitations, risks, security and privacy concerns of performing an evaluation and management service by telephone and the availability of in person appointments. I also discussed with the patient that there may be a patient responsible charge related to this service. The patient expressed understanding and agreed to proceed.    I discussed the limitations of evaluation and management by telemedicine and the availability of in person appointments. The patient expressed understanding and agreed to proceed.    Kayla Kelly is a 23 y.o. female who presents to [facility] at [facility] today for post-concussion syndrome, ADHD, and Migraine. Pt was last seen by Dr. Joane on 04/29/24, noting slow improvement in multiple domains. Migraine HA was not well controlled at that point, despite Trokendi , added Aimovig. For ADHD, medication was changed from Vyvanse  40 mg to Concerta. Advised to continue Prozac , Wellbutrin , and counseling for mood and irritability.   Today, patient reports overall gradual improvement in symptoms.  She will be leaving at [employer] at the end of this semester and trying to transfer to [employer] and live with her parents.  She notes she is in a bad situation at her current living situation and [place] with roommates that she does not get along with at all.  Headache: Aimovig started last month. Can't tell a difference yet.   ADHD: Will switch from Vyvanse  to Concerta 54 mg.  She notes this is working pretty well but the dose probably could be a bit higher.  She  Irritability is a little bit  better with the change in ADHD medicine but she still is struggling at times.    Pertinent review of systems: No fevers or chills  Relevant historical information: Diabetes.   Exam:   General: Well Developed, well nourished, and in no acute distress.   Neuropsych: Alert and oriented normal speech thought process and affect.    Lab and Radiology Results No results found for this or any previous visit (from the past 72 hours). No results found.     Assessment and Plan: 23 y.o. female with postconcussion syndrome complicated by ADHD and depression anxiety and migraine.  Migraine headache still troubling even with Trokendi .  Started Aimovig about a month ago but cannot tell a difference yet.  Plan to continue current dose of Aimovig and reassess at the 3-month mark and consider increasing.  ADHD doing much better on Concerta.  Will increase from 54 mg daily to 63 mg daily.  Mood doing okay.  Living situation.  Agree with switching to a more local school and getting out of that living situation.  Have written a note to try to get her out of her lease.  Recheck in 2 months.  Total encounter time 30 minutes including face-to-face time with the patient and, reviewing past medical record, and charting on the date of service.      PDMP not reviewed this encounter. No orders of the defined types were placed in this encounter.  Meds ordered this encounter  Medications   methylphenidate 63 MG PO TBCR    Sig: Take 63  mg by mouth every morning.    Dispense:  90 tablet    Refill:  0     Discussed warning signs or symptoms. Please see discharge instructions. Patient expresses understanding.

## 2024-06-01 ENCOUNTER — Other Ambulatory Visit (HOSPITAL_COMMUNITY): Payer: Self-pay

## 2024-06-01 ENCOUNTER — Other Ambulatory Visit: Payer: Self-pay

## 2024-06-02 ENCOUNTER — Ambulatory Visit: Admitting: Psychology

## 2024-06-09 ENCOUNTER — Ambulatory Visit (INDEPENDENT_AMBULATORY_CARE_PROVIDER_SITE_OTHER): Admitting: Psychology

## 2024-06-09 DIAGNOSIS — F4323 Adjustment disorder with mixed anxiety and depressed mood: Secondary | ICD-10-CM | POA: Diagnosis not present

## 2024-06-09 NOTE — Progress Notes (Signed)
 [facility] Counselor/Therapist Progress Note  Patient ID: Kayla Kelly, MRN: [id number],    Date: 06/09/2024  Time Spent: 58 mins    start time: 1100    end time: 1158  Treatment Type: Individual Therapy  Reported Symptoms: Pt presents for the session, via Caregility video, granting consent for the session.  Pt goes by Kayla Kelly.  Pt states she is in her bedroom in her apt at college with no one else present.  She understands the limits of virtual sessions.  I shared with pt that I am in my office with no one else here either.    Mental Status Exam: Appearance:  Casual     Behavior: Appropriate  Motor: Normal  Speech/Language:  Clear and Coherent  Affect: Appropriate  Mood: normal  Thought process: normal  Thought content:   WNL  Sensory/Perceptual disturbances:   WNL  Orientation: oriented to person, place, and time/date  Attention: Good  Concentration: Good  Memory: WNL  Fund of knowledge:  Good  Insight:   Good  Judgment:  Good  Impulse Control: Good   Risk Assessment: Danger to Self:  No Self-injurious Behavior: No Danger to Others: No Duty to Warn:no Physical Aggression / Violence:No  Access to Firearms a concern: No  Gang Involvement:No   Subjective: Pt shares she has been good since our last session.  I had a great birthday!  Lots of friends have taken me out and I got tons of birthday texts and wishes.  Pt shares that she did drink too much for her birthday celebrations but was safe and let her mom know about everything that she had done; I drunk called my mom the night of my birthday and we had nice conversation.  Pt shares that classes are going OK.  Pt is keeping up with all of her assignments.  She is still planning to finish this semester and to stay in [place] in the Spring; she is still waiting to hear from the apt complex about breaking the lease for the Spring; she will check back with them today.  Pt shares she only has one formal exam; she has a  writing assignment for one class and her acting class pt has a scene to perform and she has a scene for her Improv class.  She is going home on Tuesday next week for Thanksgiving.  She has still been hanging out with Kayla Kelly; they went to a [address] and a Latin festival in [place].  He went with her and other friends for her birthday as well.  Pt shares that she may be planning to take classes at [employer] in the Spring because of her grant that will let her go there for free.  Pt shares she has 90 credit hours at [employer] and will only have 60 hours when she gets to [employer].  She shares that she plans to take easy classes at [employer] in the Spring.  Pt shares that she continues to smoke pot daily and has been drinking more lately, because of her birthday.  Pt has been watching favorite TV shows as a means of self care as well.  Pt shares that, as of today, she has lost 46 pounds so far and she feels good about that.  She has also been intentional about drinking more water.  Pt is scheduled for a family cruise from 12/18-12/27.  Encouraged pt to go for walks and use her physical senses while she is outside on her walks.  Encouraged pt  to continue with her self care activities and we will meet next week for a follow up session.  Interventions: Cognitive Behavioral Therapy  Diagnosis:Adjustment disorder with mixed anxiety and depressed mood  Plan: Treatment Plan Strengths/Abilities:  Intelligent, Intuitive, Willing to participate in therapy Treatment Preferences:  Outpatient Individual Therapy Statement of Needs:  Patient is to use CBT, mindfulness and coping skills to help manage and/or decrease symptoms associated with their diagnosis. Symptoms:  Depressed/Irritable mood, worry, social withdrawal Problems Addressed:  Depressive thoughts, Sadness, Sleep issues, etc. Long Term Goals:  Pt to reduce overall level, frequency, and intensity of the feelings of depression/anxiety as evidenced by decreased irritability,  negative self talk, and helpless feelings from 6 to 7 days/week to 0 to 1 days/week, per client report, for at least 3 consecutive months.  Progress: 30% Short Term Goals:  Pt to verbally express understanding of the relationship between feelings of depression/anxiety and their impact on thinking patterns and behaviors.  Pt to verbalize an understanding of the role that distorted thinking plays in creating fears, excessive worry, and ruminations.  Progress: 30% Target Date:  02/18/2025 Frequency:  Bi-weekly Modality:  Cognitive Behavioral Therapy Interventions by Therapist:  Therapist will use CBT, Mindfulness exercises, Coping skills and Referrals, as needed by client. Client has verbally approved this treatment plan.  Francis KATHEE Macintosh, [facility]

## 2024-06-16 ENCOUNTER — Ambulatory Visit: Admitting: Psychology

## 2024-06-23 ENCOUNTER — Ambulatory Visit: Admitting: Psychology

## 2024-06-23 DIAGNOSIS — F4323 Adjustment disorder with mixed anxiety and depressed mood: Secondary | ICD-10-CM

## 2024-06-23 NOTE — Progress Notes (Signed)
 [facility] Counselor/Therapist Progress Note  Patient ID: Kayla Kelly, MRN: [id number],    Date: 06/23/2024  Time Spent: 60 mins    start time: 1100    end time: 1200  Treatment Type: Individual Therapy  Reported Symptoms: Pt presents for the session, via Caregility video, granting consent for the session.  Pt goes by Kayla Kelly.  Pt states she is in her bedroom in her apt at college with no one else present.  She understands the limits of virtual sessions.  I shared with pt that I am in my office with no one else here either.    Mental Status Exam: Appearance:  Casual     Behavior: Appropriate  Motor: Normal  Speech/Language:  Clear and Coherent  Affect: Appropriate  Mood: normal  Thought process: normal  Thought content:   WNL  Sensory/Perceptual disturbances:   WNL  Orientation: oriented to person, place, and time/date  Attention: Good  Concentration: Good  Memory: WNL  Fund of knowledge:  Good  Insight:   Good  Judgment:  Good  Impulse Control: Good   Risk Assessment: Danger to Self:  No Self-injurious Behavior: No Danger to Others: No Duty to Warn:no Physical Aggression / Violence:No  Access to Firearms a concern: No  Gang Involvement:No   Notified of retirement  Subjective: Pt shares she has been good since our last session.  Thanksgiving day was good and [name] came and we had a great time; the food was good and we had a nice time.  The night before me and my parents had dinner with my sister Kayla Kelly) and her wife Kayla Kelly) and Kayla Kelly was talking bad about [name] before she ever met him because [name] was raised [employer].  KJ and [name] were much nicer to [name] in person on Thanksgiving day.  Pt has continued to see [name] since she got back to [name].  She shares that her room mates have gotten two more cameras for the apt (each of their bedrooms, front door, and back patio).  Pt is glad that she is going to be moving out of the apt this month.  Pt shares that the  apt complex has allowed her to cancel her lease, effective 07/05/24.  Pt continues to drink reasonably and smokes pot each evening to help her sleep.  Pt shares she has ambivalent feelings about moving back to her family's home; she knows her parents are happy to have her back home but she has enjoyed being in [name] and going to school at [employer].  Pt starts her exams on Friday; she also has a performance in her improv class next Tuesday; she has a writing assignment for her music class that is due next Tuesday.  Pt shares that she did go on a date with a guy she met on [place] and it did not go well; she has no intention of seeing him again.  Pt shares that she has gained 10 pounds back from the 50 pounds she had lost.  Talked with pt about the empty calories in alcohol.  She and her family are flying out for their cruise on 12/16 and will be on the cruise from 12/18-12/27.  Pt has been watching favorite TV shows as a means of self care as well.  Pt shares that, as of today, she has lost 46 pounds so far and she feels good about that.  She has also been intentional about drinking more water.  Encouraged pt to continue with her self care activities  and we will meet next week for a follow up session.  Interventions: Cognitive Behavioral Therapy  Diagnosis:Adjustment disorder with mixed anxiety and depressed mood  Plan: Treatment Plan Strengths/Abilities:  Intelligent, Intuitive, Willing to participate in therapy Treatment Preferences:  Outpatient Individual Therapy Statement of Needs:  Patient is to use CBT, mindfulness and coping skills to help manage and/or decrease symptoms associated with their diagnosis. Symptoms:  Depressed/Irritable mood, worry, social withdrawal Problems Addressed:  Depressive thoughts, Sadness, Sleep issues, etc. Long Term Goals:  Pt to reduce overall level, frequency, and intensity of the feelings of depression/anxiety as evidenced by decreased irritability, negative self talk,  and helpless feelings from 6 to 7 days/week to 0 to 1 days/week, per client report, for at least 3 consecutive months.  Progress: 30% Short Term Goals:  Pt to verbally express understanding of the relationship between feelings of depression/anxiety and their impact on thinking patterns and behaviors.  Pt to verbalize an understanding of the role that distorted thinking plays in creating fears, excessive worry, and ruminations.  Progress: 30% Target Date:  02/18/2025 Frequency:  Bi-weekly Modality:  Cognitive Behavioral Therapy Interventions by Therapist:  Therapist will use CBT, Mindfulness exercises, Coping skills and Referrals, as needed by client. Client has verbally approved this treatment plan.  Francis KATHEE Macintosh, [facility]

## 2024-06-25 ENCOUNTER — Other Ambulatory Visit: Payer: Self-pay

## 2024-06-25 ENCOUNTER — Other Ambulatory Visit: Payer: Self-pay | Admitting: Family Medicine

## 2024-06-25 DIAGNOSIS — Z3042 Encounter for surveillance of injectable contraceptive: Secondary | ICD-10-CM

## 2024-06-27 MED ORDER — MEDROXYPROGESTERONE ACETATE 150 MG/ML IM SUSY
150.0000 mg | PREFILLED_SYRINGE | INTRAMUSCULAR | 1 refills | Status: AC
  Filled 2024-06-27: qty 1, 90d supply, fill #0

## 2024-06-28 ENCOUNTER — Other Ambulatory Visit (HOSPITAL_COMMUNITY): Payer: Self-pay

## 2024-06-28 ENCOUNTER — Other Ambulatory Visit: Payer: Self-pay

## 2024-06-30 ENCOUNTER — Other Ambulatory Visit (HOSPITAL_COMMUNITY): Payer: Self-pay

## 2024-06-30 ENCOUNTER — Ambulatory Visit: Admitting: Psychology

## 2024-06-30 DIAGNOSIS — F4323 Adjustment disorder with mixed anxiety and depressed mood: Secondary | ICD-10-CM | POA: Diagnosis not present

## 2024-06-30 NOTE — Progress Notes (Signed)
 [facility] Counselor/Therapist Progress Note  Patient ID: Kayla Kelly, MRN: [id number],    Date: 06/30/2024  Time Spent: 56 mins    start time: 1104    end time: 1200  Treatment Type: Individual Therapy  Reported Symptoms: Pt presents for the session, via Caregility video, granting consent for the session.  Pt goes by Kayla Kelly.  Pt states she is in her bedroom in her apt at college with no one else present.  She understands the limits of virtual sessions.  I shared with pt that I am in my office with no one else here either.    Mental Status Exam: Appearance:  Casual     Behavior: Appropriate  Motor: Normal  Speech/Language:  Clear and Coherent  Affect: Appropriate  Mood: normal  Thought process: normal  Thought content:   WNL  Sensory/Perceptual disturbances:   WNL  Orientation: oriented to person, place, and time/date  Attention: Good  Concentration: Good  Memory: WNL  Fund of knowledge:  Good  Insight:   Good  Judgment:  Good  Impulse Control: Good   Risk Assessment: Danger to Self:  No Self-injurious Behavior: No Danger to Others: No Duty to Warn:no Physical Aggression / Violence:No  Access to Firearms a concern: No  Gang Involvement:No   Notified of retirement  Subjective: Pt shares she has been good since our last session.  I finished with all of my assignments and my exams yesterday and I am glad to be finished.  Pt describes what her [employer] exam was like (performance based).  She did not enjoy it so much and was not crazy about the group she was in.  Pt is moving out of her apt on Sunday morning and moving back to [place].  She has an advising mtg tomorrow with her marketing executive at [employer].  Pt shares that she and some girlfriends went out dancing one evening recently and pt enjoyed that time with friends.  Pt continues to drink reasonably and smokes pot each evening to help her sleep.  Pt shares that she has gained 10 pounds back from the 50 pounds she  had lost.  Talked with pt about the empty calories in alcohol.  She and her family are flying out for their cruise on 12/16 and will be on the cruise from 12/18-12/27.  Pt has been watching favorite TV shows as a means of self care as well.  She has also been intentional about drinking more water.  Encouraged pt to continue with her self care activities and we will meet in 3 weeks for a follow up session, after her cruise.  Interventions: Cognitive Behavioral Therapy  Diagnosis:Adjustment disorder with mixed anxiety and depressed mood  Plan: Treatment Plan Strengths/Abilities:  Intelligent, Intuitive, Willing to participate in therapy Treatment Preferences:  Outpatient Individual Therapy Statement of Needs:  Patient is to use CBT, mindfulness and coping skills to help manage and/or decrease symptoms associated with their diagnosis. Symptoms:  Depressed/Irritable mood, worry, social withdrawal Problems Addressed:  Depressive thoughts, Sadness, Sleep issues, etc. Long Term Goals:  Pt to reduce overall level, frequency, and intensity of the feelings of depression/anxiety as evidenced by decreased irritability, negative self talk, and helpless feelings from 6 to 7 days/week to 0 to 1 days/week, per client report, for at least 3 consecutive months.  Progress: 30% Short Term Goals:  Pt to verbally express understanding of the relationship between feelings of depression/anxiety and their impact on thinking patterns and behaviors.  Pt to verbalize an  understanding of the role that distorted thinking plays in creating fears, excessive worry, and ruminations.  Progress: 30% Target Date:  02/18/2025 Frequency:  Bi-weekly Modality:  Cognitive Behavioral Therapy Interventions by Therapist:  Therapist will use CBT, Mindfulness exercises, Coping skills and Referrals, as needed by client. Client has verbally approved this treatment plan.  Francis KATHEE Macintosh, [facility]

## 2024-07-02 ENCOUNTER — Other Ambulatory Visit (HOSPITAL_COMMUNITY): Payer: Self-pay

## 2024-07-02 ENCOUNTER — Telehealth (HOSPITAL_COMMUNITY): Payer: Self-pay

## 2024-07-02 NOTE — Telephone Encounter (Signed)
 Pharmacy Patient Advocate Encounter   Received notification from Patient Pharmacy that prior authorization for Methylphenidate  HCL ER (OSM) 63 mg tablets is required/requested.   Insurance verification completed.   The patient is insured through [facility].   Per test claim:  Metadate  CD, Ritalin  LA, or Methylphenidate  IR tablet is preferred by the insurance.  If suggested medication is appropriate, Please send in a new RX and discontinue this one. If not, please advise as to why it's not appropriate so that we may request a Prior Authorization. Please note, some preferred medications may still require a PA.  If the suggested medications have not been trialed and there are no contraindications to their use, the PA will not be submitted, as it will not be approved.

## 2024-07-05 ENCOUNTER — Encounter: Payer: Self-pay | Admitting: Family Medicine

## 2024-07-05 ENCOUNTER — Other Ambulatory Visit: Payer: Self-pay

## 2024-07-06 ENCOUNTER — Other Ambulatory Visit: Payer: Self-pay

## 2024-07-06 ENCOUNTER — Other Ambulatory Visit (HOSPITAL_COMMUNITY): Payer: Self-pay

## 2024-07-06 MED ORDER — METHYLPHENIDATE HCL ER (CD) 60 MG PO CPCR
60.0000 mg | ORAL_CAPSULE | ORAL | 0 refills | Status: AC
  Filled 2024-07-06 – 2024-07-28 (×2): qty 30, 30d supply, fill #0

## 2024-07-06 NOTE — Addendum Note (Signed)
 Addended by: JOANE ARTIST RAMAN on: 07/06/2024 12:49 PM   Modules accepted: Orders

## 2024-07-06 NOTE — Telephone Encounter (Signed)
 Switching to a different form of methylphenidate  for insurance approval.

## 2024-07-07 ENCOUNTER — Ambulatory Visit: Admitting: Psychology

## 2024-07-12 ENCOUNTER — Other Ambulatory Visit (HOSPITAL_COMMUNITY): Payer: Self-pay

## 2024-07-19 ENCOUNTER — Other Ambulatory Visit (HOSPITAL_COMMUNITY): Payer: Self-pay

## 2024-07-20 ENCOUNTER — Ambulatory Visit: Admitting: Psychology

## 2024-07-20 DIAGNOSIS — F4323 Adjustment disorder with mixed anxiety and depressed mood: Secondary | ICD-10-CM

## 2024-07-20 NOTE — Progress Notes (Signed)
 "  [facility] Counselor/Therapist Progress Note  Patient ID: Kayla Kelly, MRN: [id number],    Date: 07/20/2024  Time Spent: 55 mins    start time: 1305    end time: 1400  Treatment Type: Individual Therapy  Reported Symptoms: Pt presents for the session, in person in the office, granting consent for the session. Mental Status Exam: Appearance:  Casual     Behavior: Appropriate  Motor: Normal  Speech/Language:  Clear and Coherent  Affect: Appropriate  Mood: normal  Thought process: normal  Thought content:   WNL  Sensory/Perceptual disturbances:   WNL  Orientation: oriented to person, place, and time/date  Attention: Good  Concentration: Good  Memory: WNL  Fund of knowledge:  Good  Insight:   Good  Judgment:  Good  Impulse Control: Good   Risk Assessment: Danger to Self:  No Self-injurious Behavior: No Danger to Others: No Duty to Warn:no Physical Aggression / Violence:No  Access to Firearms a concern: No  Gang Involvement:No   Notified of retirement  Subjective: Pt shares she has been ok since our last session.  My move home was a little stressful.  I ended up telling my room mates the night before I moved home and they were very nice to me and that was nice but weird.  Pt shares that their cruise was great but she bumped her head again while she was on the boat; she does not remember an incident where she bumped her head but her head was sore when she got on the plane to return home.  Pt shares she has been reading a lot since she got home from the cruise.  She shares that her dad got upset with her for not being ready when they got to [place] to take her home; on the ride home, her mom got upset at her for having no direction and pt did not understand where that came from.  Pt shares that her parents have been better since they got home from the cruise.  On the cruise, [place]. Pt shares  that they were on a [place] ship and the food was great.  Pt describes drinking quite a bit on the ship as well.  Pt enjoyed meeting new people of the cruise.  Pt shares that the weather was great everywhere they went.  Pt shares she is still planning to start classes at [employer] this semester; she left [employer] with over 90 credit hours and [employer] has only given her credit of 60 of those hours.  She is signed up for 5 classes but can't recall all of them right now; she has orientation on 1/9 and classes start 1/12.  Pt is planning to make an appt with her concussion doctor because of the subsequent head bump.  Pt has been watching favorite TV shows as a means of self care as well.  She has also been intentional about drinking more water.  Encouraged pt to continue with her self care activities and we will meet in next week for a follow up session.  Interventions: Cognitive Behavioral Therapy  Diagnosis:Adjustment disorder with mixed anxiety and depressed mood  Plan: Treatment Plan Strengths/Abilities:  Intelligent, Intuitive, Willing to participate in therapy Treatment Preferences:  Outpatient Individual Therapy Statement of Needs:  Patient is to use CBT, mindfulness and coping skills to help manage and/or decrease symptoms associated with their diagnosis. Symptoms:  Depressed/Irritable mood, worry, social  withdrawal Problems Addressed:  Depressive thoughts, Sadness, Sleep issues, etc. Long Term Goals:  Pt to reduce overall level, frequency, and intensity of the feelings of depression/anxiety as evidenced by decreased irritability, negative self talk, and helpless feelings from 6 to 7 days/week to 0 to 1 days/week, per client report, for at least 3 consecutive months.  Progress: 30% Short Term Goals:  Pt to verbally express understanding of the relationship between feelings of depression/anxiety and their impact on thinking patterns and behaviors.  Pt to verbalize an understanding of the role that  distorted thinking plays in creating fears, excessive worry, and ruminations.  Progress: 30% Target Date:  02/18/2025 Frequency:  Bi-weekly Modality:  Cognitive Behavioral Therapy Interventions by Therapist:  Therapist will use CBT, Mindfulness exercises, Coping skills and Referrals, as needed by client. Client has verbally approved this treatment plan.  Francis KATHEE Macintosh, [facility] "

## 2024-07-23 ENCOUNTER — Telehealth: Admitting: Physician Assistant

## 2024-07-23 ENCOUNTER — Other Ambulatory Visit: Payer: Self-pay

## 2024-07-23 DIAGNOSIS — J069 Acute upper respiratory infection, unspecified: Secondary | ICD-10-CM | POA: Diagnosis not present

## 2024-07-23 MED ORDER — PSEUDOEPH-BROMPHEN-DM 30-2-10 MG/5ML PO SYRP
5.0000 mL | ORAL_SOLUTION | Freq: Four times a day (QID) | ORAL | 0 refills | Status: DC | PRN
  Filled 2024-07-23: qty 120, 6d supply, fill #0

## 2024-07-23 NOTE — Progress Notes (Signed)

## 2024-07-24 ENCOUNTER — Other Ambulatory Visit (HOSPITAL_COMMUNITY): Payer: Self-pay

## 2024-07-24 ENCOUNTER — Telehealth: Admitting: Family Medicine

## 2024-07-24 DIAGNOSIS — J40 Bronchitis, not specified as acute or chronic: Secondary | ICD-10-CM

## 2024-07-24 MED ORDER — BENZONATATE 100 MG PO CAPS
200.0000 mg | ORAL_CAPSULE | Freq: Two times a day (BID) | ORAL | 0 refills | Status: DC | PRN
  Filled 2024-07-24 (×2): qty 20, 5d supply, fill #0

## 2024-07-24 MED ORDER — AZITHROMYCIN 250 MG PO TABS
ORAL_TABLET | ORAL | 0 refills | Status: AC
  Filled 2024-07-24 (×2): qty 6, 5d supply, fill #0

## 2024-07-24 NOTE — Patient Instructions (Signed)

## 2024-07-24 NOTE — Progress Notes (Signed)
 " Virtual Visit Consent   Kayla Kelly, you are scheduled for a virtual visit with a [facility] provider today. Just as with appointments in the office, your consent must be obtained to participate. Your consent will be active for this visit and any virtual visit you may have with one of our providers in the next 365 days. If you have a MyChart account, a copy of this consent can be sent to you electronically.  As this is a virtual visit, video technology does not allow for your provider to perform a traditional examination. This may limit your provider's ability to fully assess your condition. If your provider identifies any concerns that need to be evaluated in person or the need to arrange testing (such as labs, EKG, etc.), we will make arrangements to do so. Although advances in technology are sophisticated, we cannot ensure that it will always work on either your end or our end. If the connection with a video visit is poor, the visit may have to be switched to a telephone visit. With either a video or telephone visit, we are not always able to ensure that we have a secure connection.  By engaging in this virtual visit, you consent to the provision of healthcare and authorize for your insurance to be billed (if applicable) for the services provided during this visit. Depending on your insurance coverage, you may receive a charge related to this service.  I need to obtain your verbal consent now. Are you willing to proceed with your visit today? Kayla Kelly has provided verbal consent on 07/24/2024 for a virtual visit (video or telephone). Kayla Lamp, FNP  Date: 07/24/2024 10:27 AM   Virtual Visit via Video Note   I, Kayla Kelly, connected with  Kayla Kelly  ([id number], [birth year]) on 07/24/2024 at 10:15 AM EST by a video-enabled telemedicine application and verified that I am speaking with the correct person using two identifiers.  Location: Patient: Virtual Visit Location Patient: Home Provider:  Virtual Visit Location Provider: Home Office   I discussed the limitations of evaluation and management by telemedicine and the availability of in person appointments. The patient expressed understanding and agreed to proceed.    History of Present Illness: Kayla Kelly is a 24 y.o. who identifies as a female who was assigned female at birth, and is being seen today for cough. Went on a cruise a week ago and sx worsened. Sx for 8 days. Mucus is thick and green No wheezing or sob No fever. No sinus problems. SABRA  HPI: HPI  Problems:  Patient Active Problem List   Diagnosis Date Noted   Intractable chronic migraine without aura and without status migrainosus 01/29/2024   Attention deficit hyperactivity disorder (ADHD) 01/08/2024   Postconcussion syndrome 12/01/2023   Left wrist pain 12/01/2023   Left leg swelling 12/01/2023   Concentration deficit 01/20/2023   White coat syndrome without diagnosis of hypertension 01/20/2023   Insomnia due to anxiety and fear 11/12/2022   Nausea 01/20/2022   Bilateral foot pain 12/18/2021   Irritable bowel syndrome with diarrhea 12/18/2021   Mixed hyperlipidemia 12/27/2020   Chronic pain of left knee 11/15/2020   Chronic left shoulder pain 11/15/2020   Vitamin D  deficiency 11/15/2020   Adolescent idiopathic scoliosis of thoracic region 07/12/2019   MDD (major depressive disorder), recurrent severe, 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   GAD (generalized anxiety disorder) 07/07/2019   Suicide ideation 07/07/2019   Panic disorder 07/07/2019   Hypomenorrhea/oligomenorrhea 12/30/2018   Family history of  factor V deficiency 12/30/2018   Type 2 diabetes mellitus without complication, without long-term current use of insulin (HCC) 12/30/2018   FHx: factor V Leiden mutation 12/30/2018   Hepatic steatosis 12/18/2018   FHx: BRCA gene positive 12/11/2018    Allergies: Allergies[1] Medications: Current Medications[2]  Observations/Objective: Patient is  well-developed, well-nourished in no acute distress.  Resting comfortably  at home.  Head is normocephalic, atraumatic.  No labored breathing.  Speech is clear and coherent with logical content.  Patient is alert and oriented at baseline.    Assessment and Plan: 1. Bronchitis (Primary)  Increase fluids, humidifier at night, tylenol  or ibuprofen  as directed, UC as directed.   Follow Up Instructions: I discussed the assessment and treatment plan with the patient. The patient was provided an opportunity to ask questions and all were answered. The patient agreed with the plan and demonstrated an understanding of the instructions.  A copy of instructions were sent to the patient via MyChart unless otherwise noted below.     The patient was advised to call back or seek an in-person evaluation if the symptoms worsen or if the condition fails to improve as anticipated.    Neila Teem, FNP      [1]  Allergies Allergen Reactions   Benzalkonium Chloride Anaphylaxis   Neomycin-Bacitracin Zn-Polymyx Anaphylaxis   Neosporin [Bacitracin-Polymyxin B]   [2]  Current Outpatient Medications:    azithromycin  (ZITHROMAX ) 250 MG tablet, Take 2 tablets on day 1, then 1 tablet daily on days 2 through 5, Disp: 6 tablet, Rfl: 0   benzonatate  (TESSALON ) 100 MG capsule, Take 2 capsules (200 mg total) by mouth 2 (two) times daily as needed for up to 10 days for cough., Disp: 20 capsule, Rfl: 0   blood glucose meter kit and supplies KIT, Use to test blood sugar daily, Disp: 1 each, Rfl: 0   brompheniramine-pseudoephedrine-DM 30-2-10 MG/5ML syrup, Take 5 mLs by mouth 4 (four) times daily as needed., Disp: 120 mL, Rfl: 0   buPROPion  (WELLBUTRIN  XL) 300 MG 24 hr tablet, Take 1 tablet (300 mg total) by mouth daily., Disp: 90 tablet, Rfl: 1   clonazePAM  (KLONOPIN ) 0.5 MG tablet, Take 1 tablet (0.5 mg total) by mouth 2 (two) times daily as needed for anxiety., Disp: 60 tablet, Rfl: 1   clonazePAM  (KLONOPIN ) 0.5  MG tablet, Take 1 tablet (0.5 mg total) by mouth 2 (two) times daily., Disp: 180 tablet, Rfl: 0   dicyclomine  (BENTYL ) 20 MG tablet, Take 1 tablet (20 mg total) by mouth every 8 (eight) hours as needed for spasms., Disp: 60 tablet, Rfl: 2   Erenumab -aooe (AIMOVIG ) 70 MG/ML SOAJ, Inject 70 mg into the skin every 30 (thirty) days., Disp: 1.12 mL, Rfl: 12   FLUoxetine  (PROZAC ) 20 MG capsule, Take 3 capsules (60 mg) by mouth daily., Disp: 270 capsule, Rfl: 1   glucose blood test strip, Use to test blood sugar daily, Disp: 100 each, Rfl: 0   Lancets (FREESTYLE) lancets, Use to test blood sugar daily, Disp: 100 each, Rfl: 0   medroxyPROGESTERone  Acetate 150 MG/ML SUSY, Inject 1 mL (150 mg total) into the muscle every 3 (three) months., Disp: 1 mL, Rfl: 1   methocarbamol  (ROBAXIN ) 500 MG tablet, Take 1 tablet (500 mg total) by mouth 2 (two) times daily., Disp: 20 tablet, Rfl: 0   methylcellulose (CITRUCEL) oral powder, Take 1 packet by mouth daily., Disp: , Rfl:    methylphenidate  (METADATE  CD) 60 MG CR capsule, Take 1 capsule (60 mg  total) by mouth every morning., Disp: 30 capsule, Rfl: 0   omeprazole  (PRILOSEC) 20 MG capsule, Take 1 capsule (20 mg total) by mouth daily., Disp: 90 capsule, Rfl: 3   ondansetron  (ZOFRAN -ODT) 4 MG disintegrating tablet, Dissolve 1-2 tablets (4-8 mg total) by mouth every 8 (eight) hours as needed., Disp: 90 tablet, Rfl: 3   Semaglutide , 2 MG/DOSE, (OZEMPIC , 2 MG/DOSE,) 8 MG/3ML SOPN, Inject 2 mg as directed once a week., Disp: 3 mL, Rfl: 3   Topiramate  ER (TROKENDI  XR) 100 MG CP24, Take 1 capsule (100 mg total) by mouth daily., Disp: 90 capsule, Rfl: 1   traZODone  (DESYREL ) 50 MG tablet, Take 0.5-1 tablets (25-50 mg total) by mouth at bedtime as needed for sleep., Disp: 30 tablet, Rfl: 1  Current Facility-Administered Medications:    0.9 %  sodium chloride  infusion, 500 mL, Intravenous, Once, Armbruster, Elspeth SQUIBB, MD  "

## 2024-07-26 ENCOUNTER — Other Ambulatory Visit: Payer: Self-pay

## 2024-07-26 ENCOUNTER — Encounter (HOSPITAL_COMMUNITY): Payer: Self-pay | Admitting: Pharmacist

## 2024-07-26 ENCOUNTER — Other Ambulatory Visit (HOSPITAL_COMMUNITY): Payer: Self-pay

## 2024-07-28 ENCOUNTER — Ambulatory Visit (INDEPENDENT_AMBULATORY_CARE_PROVIDER_SITE_OTHER): Payer: Self-pay | Admitting: Psychology

## 2024-07-28 ENCOUNTER — Other Ambulatory Visit (HOSPITAL_COMMUNITY): Payer: Self-pay

## 2024-07-28 DIAGNOSIS — F4323 Adjustment disorder with mixed anxiety and depressed mood: Secondary | ICD-10-CM
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
## 2024-07-29 ENCOUNTER — Other Ambulatory Visit: Payer: Self-pay

## 2024-07-30 ENCOUNTER — Other Ambulatory Visit: Payer: Self-pay

## 2024-08-03 ENCOUNTER — Encounter: Payer: Self-pay | Admitting: Family Medicine

## 2024-08-03 ENCOUNTER — Ambulatory Visit: Admitting: Psychology

## 2024-08-03 ENCOUNTER — Ambulatory Visit: Admitting: Family Medicine

## 2024-08-03 VITALS — BP 126/72 | HR 99 | Ht 67.0 in | Wt 306.1 lb

## 2024-08-03 DIAGNOSIS — Z23 Encounter for immunization: Secondary | ICD-10-CM | POA: Diagnosis not present

## 2024-08-03 DIAGNOSIS — Z7985 Long-term (current) use of injectable non-insulin antidiabetic drugs: Secondary | ICD-10-CM

## 2024-08-03 DIAGNOSIS — F4323 Adjustment disorder with mixed anxiety and depressed mood: Secondary | ICD-10-CM | POA: Diagnosis not present

## 2024-08-03 DIAGNOSIS — E119 Type 2 diabetes mellitus without complications: Secondary | ICD-10-CM

## 2024-08-03 DIAGNOSIS — F0781 Postconcussional syndrome: Secondary | ICD-10-CM | POA: Diagnosis not present

## 2024-08-03 NOTE — Progress Notes (Signed)
 "  [facility] Counselor/Therapist Progress Note  Patient ID: Kayla Kelly, MRN: [id number],    Date: 08/03/2024  Time Spent: 58 mins    start time: 1400    end time: 1458  Treatment Type: Individual Therapy  Reported Symptoms: Pt presents for the session, in person in the office, granting consent for the session.  Mental Status Exam: Appearance:  Casual     Behavior: Appropriate  Motor: Normal  Speech/Language:  Clear and Coherent  Affect: Appropriate  Mood: normal  Thought process: normal  Thought content:   WNL  Sensory/Perceptual disturbances:   WNL  Orientation: oriented to person, place, and time/date  Attention: Good  Concentration: Good  Memory: WNL  Fund of knowledge:  Good  Insight:   Good  Judgment:  Good  Impulse Control: Good   Risk Assessment: Danger to Self:  No Self-injurious Behavior: No Danger to Others: No Duty to Warn:no Physical Aggression / Violence:No  Access to Firearms a concern: No  Gang Involvement:No   Notified of retirement  Subjective: Pt shares she had her orientation at [employer] last Friday and classes started yesterday.  She is liking her classes so far.  She met a couple of people today in one of her classes that she thinks she can be friends with and she is happy with that.  Pt shares that she is till trying to figure out what she wants to do with her life.  She is taking 15 credit hours and is thinking about adding another class for 18 hours; she may consider dropping one of her other classes.  Pt shares that she is trying to make friends and it is not going well for her; reminded pt she has only been at [facility] for a day and a half and she bristled a bit at this idea; but, Francis, I make friends easily where ever I go.  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  Pt shares that living at home with her parents is weird so far.  Pt shares  her parents seem to be trying really hard but it still feels strange for her to be in the family home instead of in [place].  She said that she does not have friends in [place] yet so that part feels strange for her.  Pt gets tearful during the session and shares she is not feeling great emotionally right now; she wishes that she could still be at [facility] with her friends; she feels strange being back at home and starting classes at a different school.  She shares she is not smoking pot right now due to her bumping her head again on the plane on her trip.  Pt shares she misses everything about her life in [place]; this just kind of sucks.  Pt shares she does not have an appt yet with concussion doctor.  Pt shares she continues to have some headaches and she does not like that.      Pt shares she has been texting with [name] some but she shares that things are dying down a bit because of the holidays, getting ready for school and because of her health issues.  Pt shares her reading has died down a bit; she has been watching movies with her parents.  Pt has been watching favorite TV shows as a means of self care as well.  She has  also been intentional about drinking more water.  Encouraged pt to continue with her self care activities and we will meet in next week for a follow up session.  Interventions: Cognitive Behavioral Therapy  Diagnosis:Adjustment disorder with mixed anxiety and depressed mood  Plan: Treatment Plan Strengths/Abilities:  Intelligent, Intuitive, Willing to participate in therapy Treatment Preferences:  Outpatient Individual Therapy Statement of Needs:  Patient is to use CBT, mindfulness and coping skills to help manage and/or decrease symptoms associated with their diagnosis. Symptoms:  Depressed/Irritable mood, worry, social withdrawal Problems Addressed:  Depressive thoughts, Sadness, Sleep issues, etc. Long Term Goals:  Pt to reduce overall level, frequency, and intensity of  the feelings of depression/anxiety as evidenced by decreased irritability, negative self talk, and helpless feelings from 6 to 7 days/week to 0 to 1 days/week, per client report, for at least 3 consecutive months.  Progress: 30% Short Term Goals:  Pt to verbally express understanding of the relationship between feelings of depression/anxiety and their impact on thinking patterns and behaviors.  Pt to verbalize an understanding of the role that distorted thinking plays in creating fears, excessive worry, and ruminations.  Progress: 30% Target Date:  02/18/2025 Frequency:  Bi-weekly Modality:  Cognitive Behavioral Therapy Interventions by Therapist:  Therapist will use CBT, Mindfulness exercises, Coping skills and Referrals, as needed by client. Client has verbally approved this treatment plan.  Francis KATHEE Macintosh, [facility] "

## 2024-08-03 NOTE — Assessment & Plan Note (Signed)
 Pt was on a cruise over [place] and believes she hit her head at night while sleeping bc she developed similar symptoms to her previous concussion. These are starting to improve.  She is planning on messaging the sports medicine provider who was seeing her for her first concussion.

## 2024-08-03 NOTE — Progress Notes (Unsigned)
 "  Established Patient Office Visit  Subjective   Patient ID: Kayla Kelly, female    DOB: February 05, 2001  Age: 24 y.o. MRN: [id number]  Chief Complaint  Patient presents with   Immunizations    Discussed the use of AI scribe software for clinical note transcription with the patient, who gave verbal consent to proceed.  History of Present Illness   Kayla Kelly is a 24 year old female with a history of minor traumatic brain injury who presents for a follow-up after a recent head injury.  Approximately two weeks ago, she likely hit her head while sleeping in a small bed on an eight-day cruise. She noticed scalp tenderness and headache at the airport without immediate symptoms beforehand. The next day she developed recurrence of prior TBI-type symptoms with severe stuttering, word-finding difficulty, and hair loss, which have since partially improved.  Since the injury she has had a return of migraines that were previously well controlled on Aimovig  and topiramate , along with episodes of wonky vision and feeling intoxicated despite no alcohol use.  She currently takes Wellbutrin  and Prozac  daily. She has decreased her use of sleep medications because of grogginess on waking. She uses Zofran  and dicyclomine  as needed for stomach issues, which have improved since her initial accident.  She recently transferred to [employer] after a semester at [employer] and moved back home. She is seeking school accommodations due to ongoing post-injury symptoms affecting her academic functioning.          The ASCVD Risk score (Arnett DK, et al., 2019) failed to calculate for the following reasons:   The 2019 ASCVD risk score is only valid for ages 27 to 33   * - Cholesterol units were assumed  Health Maintenance Due  Topic Date Due   OPHTHALMOLOGY EXAM  Never done   Meningococcal B Vaccine (1 of 2 - Standard) Never done   Pneumococcal Vaccine (1 of 2 - PCV) Never done   Diabetic kidney evaluation - Urine  ACR  01/20/2024   FOOT EXAM  01/20/2024   Influenza Vaccine  02/20/2024   COVID-19 Vaccine (6 - 2025-26 season) 03/22/2024   Cervical Cancer Screening (Pap smear)  Never done   HEMOGLOBIN A1C  06/02/2024      Objective:     BP 126/72   Pulse 99   Ht 5' 7 (1.702 m)   Wt (!) 306 lb 1.9 oz (138.9 kg)   SpO2 98%   BMI 47.95 kg/m  {Vitals History (Optional):23777}  Physical Exam     Gen: alert, oriented Pulm: no respiratory distress Psych: pleasant affect       No results found for any visits on 08/03/24.      Assessment & Plan:   Type 2 diabetes mellitus without complication, without long-term current use of insulin (HCC) Assessment & Plan: Taking ozempic  2mg , will schedule physical in 3 months and check labs prior to that.    Postconcussion syndrome Assessment & Plan: Pt was on a cruise over christmas and believes she hit her head at night while sleeping bc she developed similar symptoms to her previous concussion. These are starting to improve.  She is planning on messaging the sports medicine provider who was seeing her for her first concussion.    Other orders -     Meningococcal conjugate vaccine 4-valent IM      Vaccination for school requirements Requires vaccination to meet school requirements. - Administered vaccination. - Provided proof of vaccination for school.  Return in about 3 months (around 11/01/2024) for physical.    Toribio MARLA Slain, MD  "

## 2024-08-03 NOTE — Assessment & Plan Note (Signed)
 Taking ozempic  2mg , will schedule physical in 3 months and check labs prior to that.

## 2024-08-04 NOTE — Telephone Encounter (Signed)
 Forwarding to Dr. Denyse Amass to review and advise.

## 2024-08-06 ENCOUNTER — Other Ambulatory Visit (HOSPITAL_COMMUNITY): Payer: Self-pay

## 2024-08-06 NOTE — Telephone Encounter (Signed)
 DMV form for disability parking placard emailed to pt per request.

## 2024-08-10 ENCOUNTER — Ambulatory Visit: Admitting: Psychology

## 2024-08-10 DIAGNOSIS — F4323 Adjustment disorder with mixed anxiety and depressed mood: Secondary | ICD-10-CM | POA: Diagnosis not present

## 2024-08-10 NOTE — Progress Notes (Signed)
 "  [facility] Counselor/Therapist Progress Note  Patient ID: Kayla Kelly, MRN: [id number],    Date: 08/10/2024  Time Spent: 58 mins    start time: 1400    end time: 1458  Treatment Type: Individual Therapy  Reported Symptoms: Pt presents for the session, in person in the office, granting consent for the session.  Mental Status Exam: Appearance:  Casual     Behavior: Appropriate  Motor: Normal  Speech/Language:  Clear and Coherent  Affect: Appropriate  Mood: normal  Thought process: normal  Thought content:   WNL  Sensory/Perceptual disturbances:   WNL  Orientation: oriented to person, place, and time/date  Attention: Good  Concentration: Good  Memory: WNL  Fund of knowledge:  Good  Insight:   Good  Judgment:  Good  Impulse Control: Good   Risk Assessment: Danger to Self:  No Self-injurious Behavior: No Danger to Others: No Duty to Warn:no Physical Aggression / Violence:No  Access to Firearms a concern: No  Gang Involvement:No   Notified of retirement  Subjective: Pt shares she has begun to make friends at [employer] this past week; she sees this as progress from last week.  Pt shares, I dropped the Fashion class and added the Event Planning class.  I feel good about those choices.  Pt shares she has been sleeping a lot and has been reading for pleasure as well.  Pt shares that she continues to need to adjust to living at home with her parents as she goes to school; she had a negative interaction with her dad this past week but they seem to have worked through the issue.  Pt shares that her sister, [name], will be coming soon for an internship and will be living at home for a while.  Pt is not looking forward to her being here because they have struggled with their relationship multiple times in the past.  Pt continues to take Ozempic  and she has lost weight.  She shares that she lost 50 pounds last semester and has gained some of it back.  Her parents are not so  appropriately supportive of her desire to eat healthy.  She shares that she averaged three protein shakes shares per day and limited herself to 600 calories per day to lose the 50 pounds.  She is enjoying her classes and is keeping up with her assignments at this point.  She is taking Information Systems, Tourism, Chiropodist, Occupational Hygienist, and a Business class.  She shares she is not smoking pot regularly right now due to her bumping her head again on the plane on her trip.  Pt shares she does not have an appt yet with concussion doctor.  Pt shares she continues to have some headaches and she does not like that.  Pt has been watching favorite TV shows as a means of self care as well.  Encouraged pt to continue with her self care activities and we will meet in next week for a follow up session.  Interventions: Cognitive Behavioral Therapy  Diagnosis:Adjustment disorder with mixed anxiety and depressed mood  Plan: Treatment Plan Strengths/Abilities:  Intelligent, Intuitive, Willing to participate in therapy Treatment Preferences:  Outpatient Individual Therapy Statement of Needs:  Patient is to use CBT, mindfulness and coping skills to help manage and/or decrease symptoms associated with their diagnosis. Symptoms:  Depressed/Irritable mood, worry, social withdrawal Problems Addressed:  Depressive thoughts, Sadness, Sleep issues, etc. Long Term Goals:  Pt to reduce overall level, frequency, and intensity of the  feelings of depression/anxiety as evidenced by decreased irritability, negative self talk, and helpless feelings from 6 to 7 days/week to 0 to 1 days/week, per client report, for at least 3 consecutive months.  Progress: 30% Short Term Goals:  Pt to verbally express understanding of the relationship between feelings of depression/anxiety and their impact on thinking patterns and behaviors.  Pt to verbalize an understanding of the role that distorted thinking plays in creating fears, excessive  worry, and ruminations.  Progress: 30% Target Date:  02/18/2025 Frequency:  Bi-weekly Modality:  Cognitive Behavioral Therapy Interventions by Therapist:  Therapist will use CBT, Mindfulness exercises, Coping skills and Referrals, as needed by client. Client has verbally approved this treatment plan.  Francis KATHEE Macintosh, [facility] "

## 2024-08-17 ENCOUNTER — Ambulatory Visit: Admitting: Psychology

## 2024-08-17 DIAGNOSIS — F4323 Adjustment disorder with mixed anxiety and depressed mood: Secondary | ICD-10-CM

## 2024-08-17 NOTE — Progress Notes (Signed)
 "  [facility] Counselor/Therapist Progress Note  Patient ID: Kayla Kelly, MRN: [id number],    Date: 08/17/2024  Time Spent: 59 mins    start time: 1400    end time: 1459  Treatment Type: Individual Therapy  Reported Symptoms: Pt presents for the session, via Caregility video, granting consent for the session.  Pt states that she is in her home with no one else present; she understands the limits of virtual sessions.  I shared with pt that I am in my office with no one else here.  Mental Status Exam: Appearance:  Casual     Behavior: Appropriate  Motor: Normal  Speech/Language:  Clear and Coherent  Affect: Appropriate  Mood: normal  Thought process: normal  Thought content:   WNL  Sensory/Perceptual disturbances:   WNL  Orientation: oriented to person, place, and time/date  Attention: Good  Concentration: Good  Memory: WNL  Fund of knowledge:  Good  Insight:   Good  Judgment:  Good  Impulse Control: Good   Risk Assessment: Danger to Self:  No Self-injurious Behavior: No Danger to Others: No Duty to Warn:no Physical Aggression / Violence:No  Access to Firearms a concern: No  Gang Involvement:No   Notified of retirement  Subjective: Pt shares she has not had class so far this week, due to the sleet/snow.  She had a presentation scheduled for today that she got completed but did not have to do it because class was canceled for today at 430pm yesterday.  She is keeping up with her homework and reading, as is normally the case with her.  Pt is continuing to focus on making friends and she is reading a lot; she has read two books this weekend already and has started a third on (series).  She has also enjoyed playing with her dogs as well.  Pt shares she did finally get all of the info submitted to the [employer] for a job and she is thinking that she will stay in school, even if she gets the job.  Pt shares she went to a club mtg at [facility] this past week  and it was a bit strange for her; she did not enjoy it so much.  Pt has learned that there are two speed dating events coming up soon and she is looking forward to both of them; congratulated pt on getting involved with activities that she enjoys.  Her friend, Kayla Kelly, was going to come for a visit this past weekend, but could not because of the weather; he will plan to come another weekend.  She has reached out to her friend, Kayla Kelly, from [place] and they have texted a bit but Kayla Kelly has not been making very good choices lately.  Pt shares that she and her parents have been cooped up a lot inside over the weekend but it has been pretty good; they have all eaten well as well.  Pt shares that her sister, Kayla Kelly, will be coming for an internship and will be living at home for a while; she will be here sometime between the middle of February and mid March.  Pt is not looking forward to her being here because they have struggled with their relationship multiple times in the past.  Pt continues to take Ozempic  and she has lost weight.  She is taking Information Systems, Tourism, Chiropodist, Occupational Hygienist, and a Business class.  She shares she is not smoking pot regularly right now due to her bumping  her head again on the plane on her trip.  Pt shares she continues to have some headaches and she does not like that.  Pt has been watching favorite TV shows as a means of self care as well.  Encouraged pt to continue with her self care activities and we will meet in next week for a follow up session.  Interventions: Cognitive Behavioral Therapy  Diagnosis:Adjustment disorder with mixed anxiety and depressed mood  Plan: Treatment Plan Strengths/Abilities:  Intelligent, Intuitive, Willing to participate in therapy Treatment Preferences:  Outpatient Individual Therapy Statement of Needs:  Patient is to use CBT, mindfulness and coping skills to help manage and/or decrease symptoms associated with their diagnosis. Symptoms:   Depressed/Irritable mood, worry, social withdrawal Problems Addressed:  Depressive thoughts, Sadness, Sleep issues, etc. Long Term Goals:  Pt to reduce overall level, frequency, and intensity of the feelings of depression/anxiety as evidenced by decreased irritability, negative self talk, and helpless feelings from 6 to 7 days/week to 0 to 1 days/week, per client report, for at least 3 consecutive months.  Progress: 30% Short Term Goals:  Pt to verbally express understanding of the relationship between feelings of depression/anxiety and their impact on thinking patterns and behaviors.  Pt to verbalize an understanding of the role that distorted thinking plays in creating fears, excessive worry, and ruminations.  Progress: 30% Target Date:  02/18/2025 Frequency:  Bi-weekly Modality:  Cognitive Behavioral Therapy Interventions by Therapist:  Therapist will use CBT, Mindfulness exercises, Coping skills and Referrals, as needed by client. Client has verbally approved this treatment plan.  Francis KATHEE Macintosh, [facility] "

## 2024-08-24 ENCOUNTER — Ambulatory Visit: Admitting: Psychology

## 2024-08-24 DIAGNOSIS — F4323 Adjustment disorder with mixed anxiety and depressed mood: Secondary | ICD-10-CM

## 2024-08-26 ENCOUNTER — Other Ambulatory Visit (HOSPITAL_COMMUNITY): Payer: Self-pay

## 2024-08-27 ENCOUNTER — Other Ambulatory Visit: Payer: Self-pay

## 2024-08-31 ENCOUNTER — Ambulatory Visit: Admitting: Psychology

## 2024-09-07 ENCOUNTER — Ambulatory Visit: Admitting: Psychology

## 2024-09-14 ENCOUNTER — Ambulatory Visit: Admitting: Psychology

## 2024-09-21 ENCOUNTER — Ambulatory Visit: Admitting: Psychology

## 2024-09-28 ENCOUNTER — Ambulatory Visit: Admitting: Psychology

## 2024-10-19 ENCOUNTER — Ambulatory Visit: Admitting: Psychology
